# Patient Record
Sex: Male | Born: 1975 | Race: White | Hispanic: No | Marital: Married | Smoking: Current some day smoker
Health system: Southern US, Community
[De-identification: ages and names within clinical notes are randomized; demographics above are authoritative.]

## PROBLEM LIST (undated history)

## (undated) DIAGNOSIS — F909 Attention-deficit hyperactivity disorder, unspecified type: Secondary | ICD-10-CM

## (undated) DIAGNOSIS — I10 Essential (primary) hypertension: Secondary | ICD-10-CM

## (undated) DIAGNOSIS — K219 Gastro-esophageal reflux disease without esophagitis: Secondary | ICD-10-CM

## (undated) HISTORY — PX: BICEPS TENDON REPAIR: SHX566

## (undated) HISTORY — DX: Attention-deficit hyperactivity disorder, unspecified type: F90.9

## (undated) HISTORY — DX: Essential (primary) hypertension: I10

## (undated) HISTORY — DX: Gastro-esophageal reflux disease without esophagitis: K21.9

## (undated) HISTORY — PX: SHOULDER SURGERY: SHX246

---

## 1998-04-15 ENCOUNTER — Ambulatory Visit (HOSPITAL_COMMUNITY): Admission: RE | Admit: 1998-04-15 | Discharge: 1998-04-15 | Payer: Self-pay | Admitting: Sports Medicine

## 1998-04-15 ENCOUNTER — Encounter: Admission: RE | Admit: 1998-04-15 | Discharge: 1998-04-15 | Payer: Self-pay | Admitting: Sports Medicine

## 2001-02-07 ENCOUNTER — Encounter: Admission: RE | Admit: 2001-02-07 | Discharge: 2001-02-07 | Payer: Self-pay | Admitting: Family Medicine

## 2002-05-05 ENCOUNTER — Ambulatory Visit (HOSPITAL_BASED_OUTPATIENT_CLINIC_OR_DEPARTMENT_OTHER): Admission: RE | Admit: 2002-05-05 | Discharge: 2002-05-05 | Payer: Self-pay | Admitting: *Deleted

## 2003-01-26 ENCOUNTER — Ambulatory Visit (HOSPITAL_BASED_OUTPATIENT_CLINIC_OR_DEPARTMENT_OTHER): Admission: RE | Admit: 2003-01-26 | Discharge: 2003-01-26 | Payer: Self-pay | Admitting: Orthopedic Surgery

## 2010-04-18 ENCOUNTER — Encounter: Payer: Self-pay | Admitting: Sports Medicine

## 2010-08-29 NOTE — Letter (Signed)
Summary: ROI  ROI   Imported By: Marily Memos 04/19/2010 15:41:52  _____________________________________________________________________  External Attachment:    Type:   Image     Comment:   External Document

## 2013-10-26 ENCOUNTER — Other Ambulatory Visit: Payer: Self-pay | Admitting: Family Medicine

## 2013-10-26 DIAGNOSIS — R7989 Other specified abnormal findings of blood chemistry: Secondary | ICD-10-CM

## 2013-10-26 DIAGNOSIS — R945 Abnormal results of liver function studies: Principal | ICD-10-CM

## 2013-11-19 ENCOUNTER — Ambulatory Visit
Admission: RE | Admit: 2013-11-19 | Discharge: 2013-11-19 | Disposition: A | Discharge status: Home / Self Care | Payer: BC Managed Care – PPO | Source: Ambulatory Visit | Attending: Family Medicine | Admitting: Family Medicine

## 2013-11-19 DIAGNOSIS — R945 Abnormal results of liver function studies: Principal | ICD-10-CM

## 2013-11-19 DIAGNOSIS — R7989 Other specified abnormal findings of blood chemistry: Secondary | ICD-10-CM

## 2014-10-19 ENCOUNTER — Ambulatory Visit
Admission: RE | Admit: 2014-10-19 | Discharge: 2014-10-19 | Disposition: A | Discharge status: Home / Self Care | Payer: BLUE CROSS/BLUE SHIELD | Source: Ambulatory Visit | Attending: Family Medicine | Admitting: Family Medicine

## 2014-10-19 ENCOUNTER — Other Ambulatory Visit: Payer: Self-pay | Admitting: Family Medicine

## 2014-10-19 DIAGNOSIS — J209 Acute bronchitis, unspecified: Secondary | ICD-10-CM

## 2016-02-15 DIAGNOSIS — F900 Attention-deficit hyperactivity disorder, predominantly inattentive type: Secondary | ICD-10-CM | POA: Diagnosis not present

## 2016-02-15 DIAGNOSIS — Z113 Encounter for screening for infections with a predominantly sexual mode of transmission: Secondary | ICD-10-CM | POA: Diagnosis not present

## 2016-02-15 DIAGNOSIS — I10 Essential (primary) hypertension: Secondary | ICD-10-CM | POA: Diagnosis not present

## 2016-02-15 DIAGNOSIS — R600 Localized edema: Secondary | ICD-10-CM | POA: Diagnosis not present

## 2016-03-10 IMAGING — US US ABDOMEN COMPLETE
1 series · 14 of 25 positions shown · non-contrast
Comparison: None.

CLINICAL DATA: 37-year-old male with elevated LFTs.

EXAM:
ULTRASOUND ABDOMEN COMPLETE

[Series 1: us abdomen complete · 0.42mm/px · 14 of 82 slices shown]
[im 1/82]
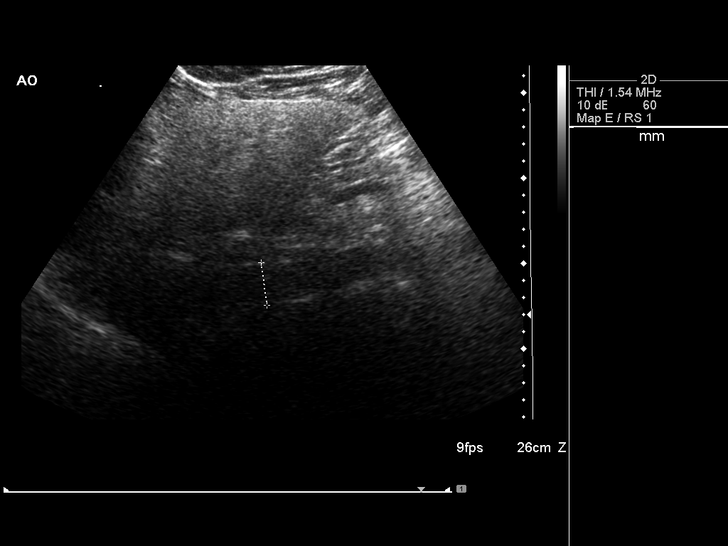
[im 7/82]
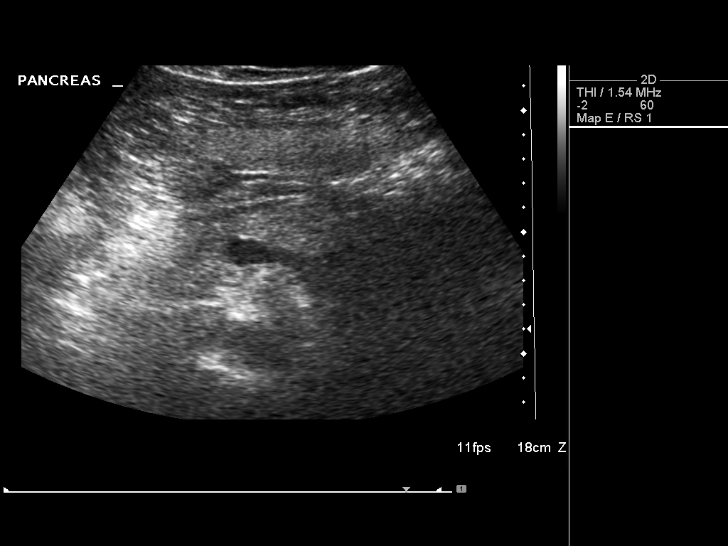
[im 14/82]
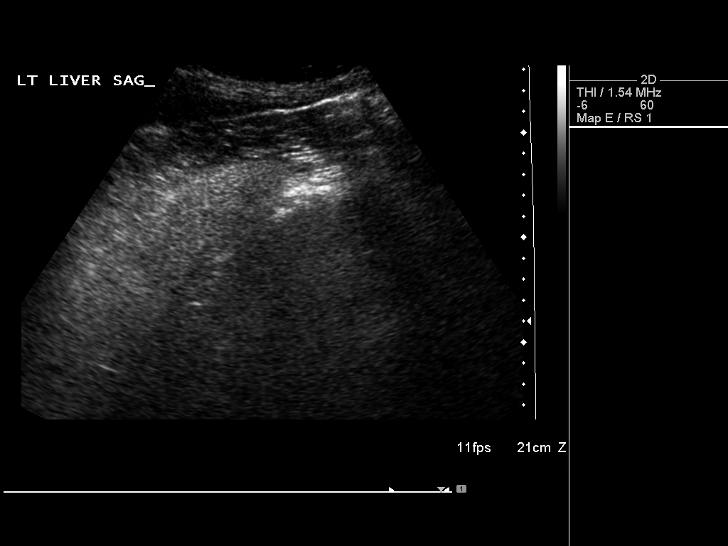
[im 21/82]
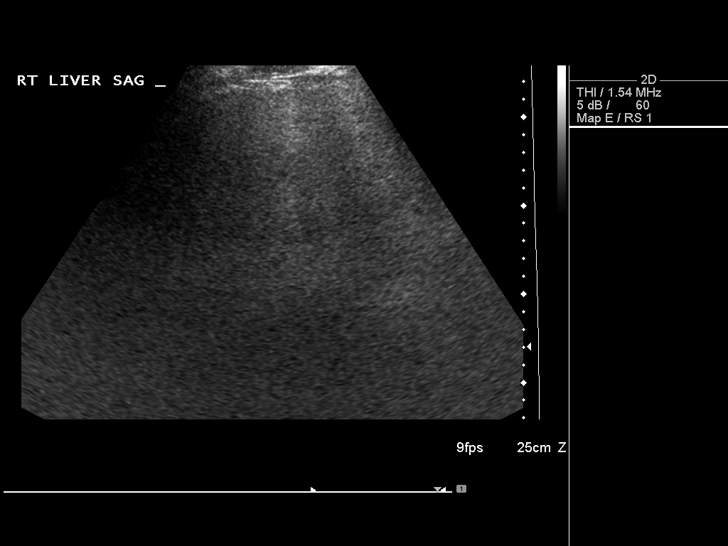
[im 28/82]
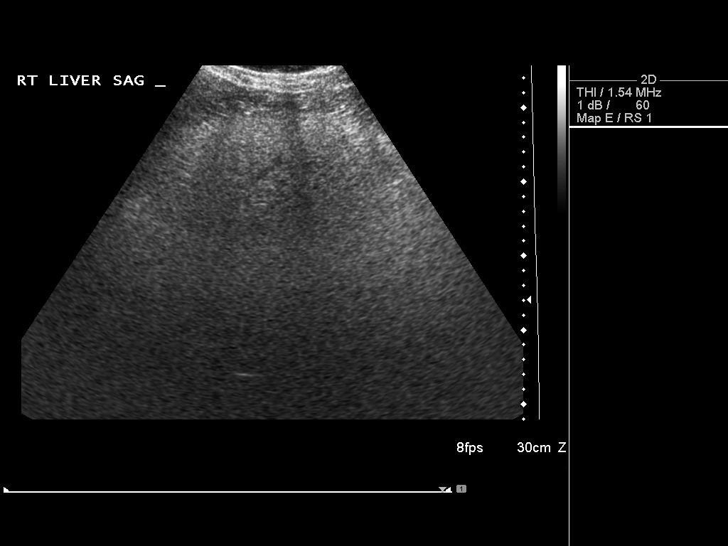
[im 31/82]
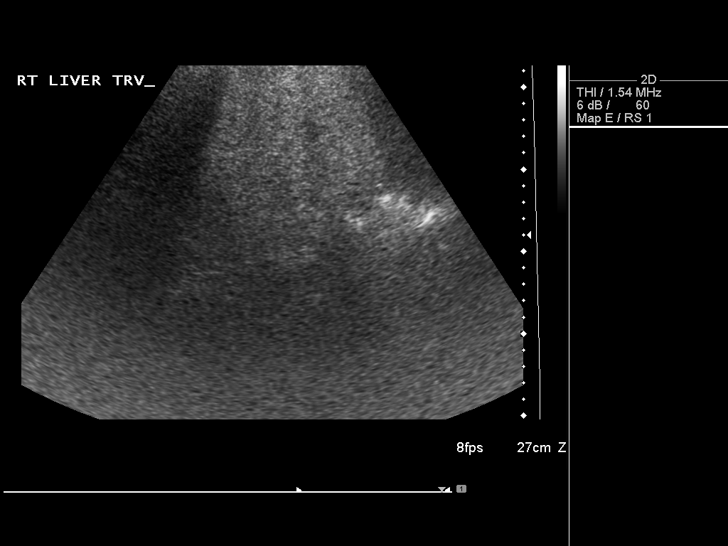
[im 38/82]
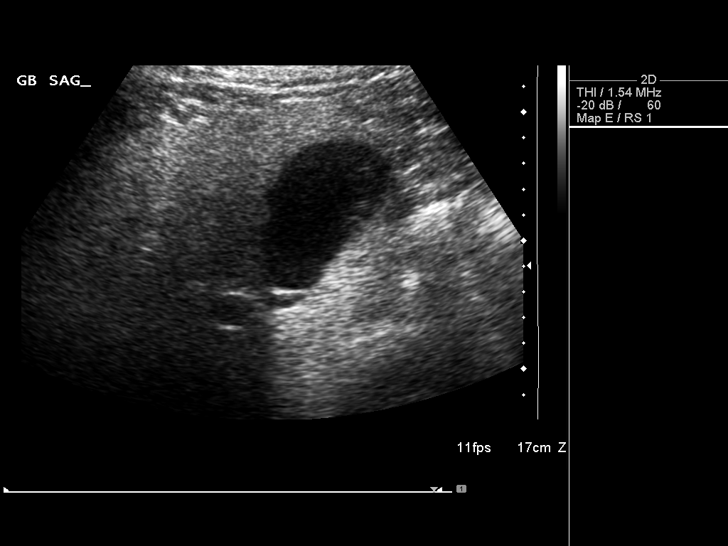
[im 44/82]
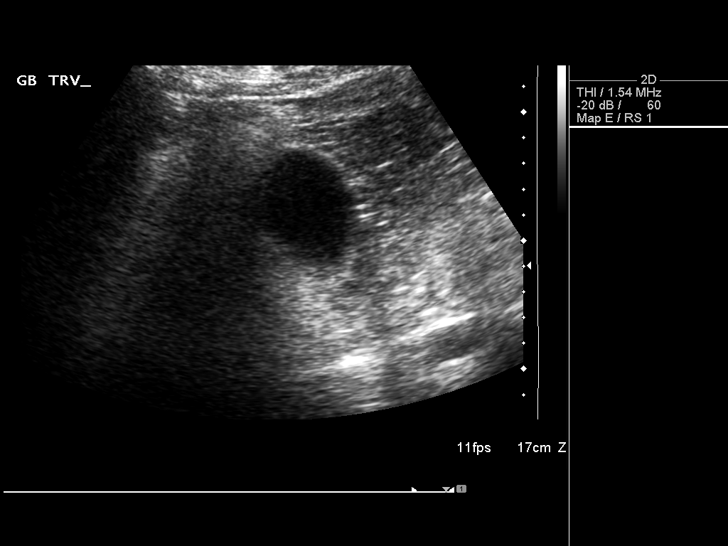
[im 51/82]
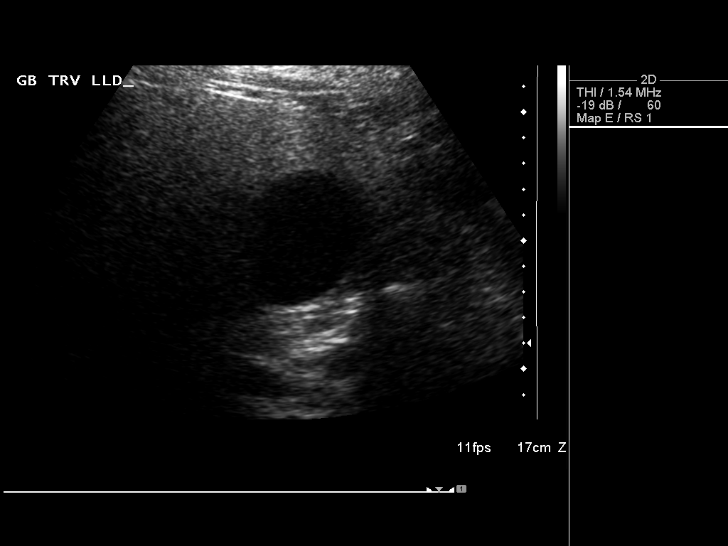
[im 55/82]
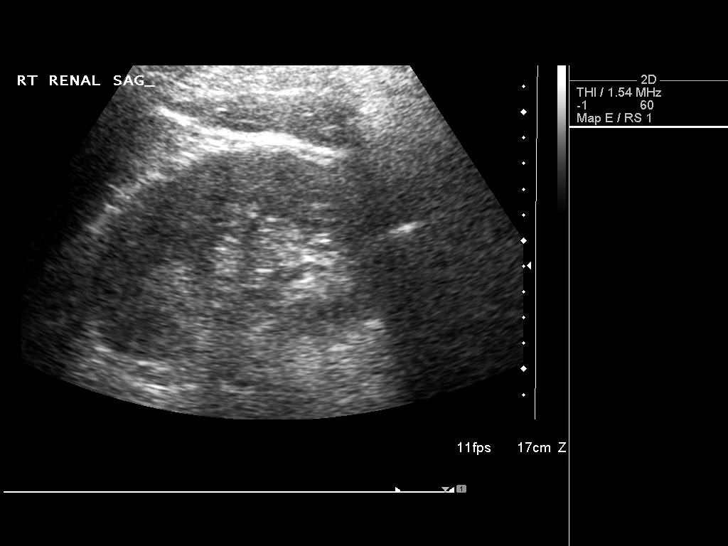
[im 61/82]
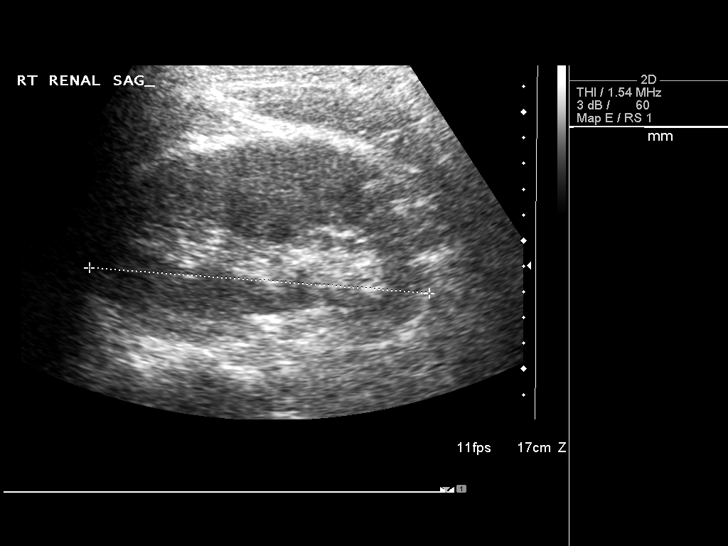
[im 68/82]
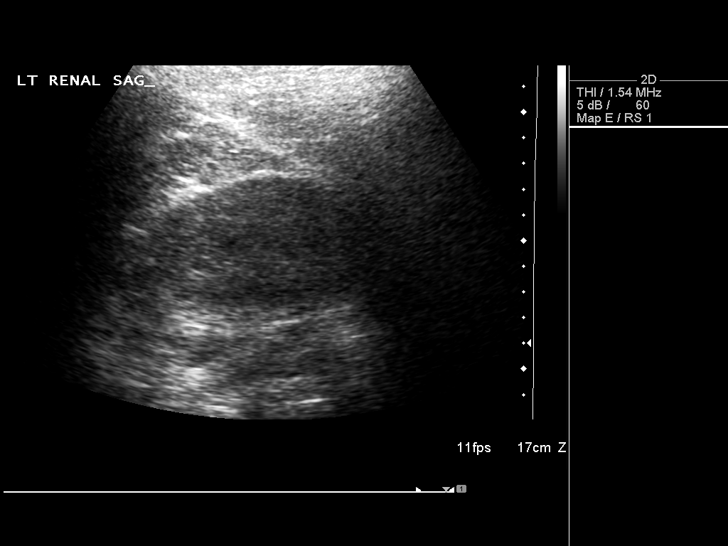
[im 75/82]
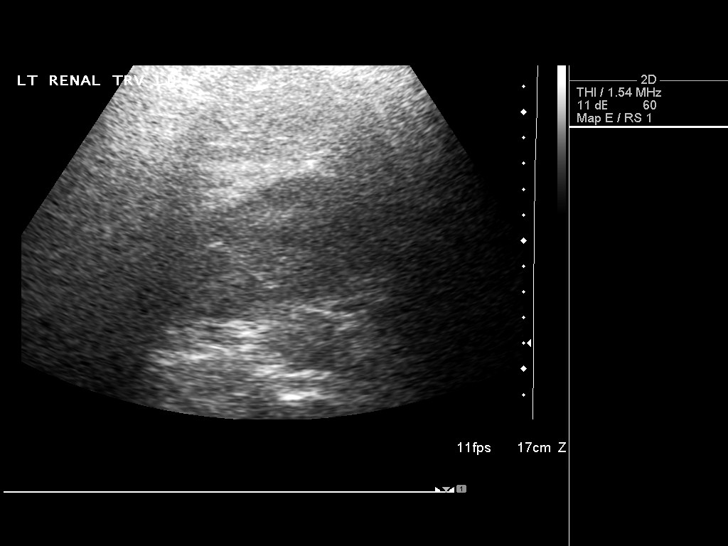
[im 82/82]
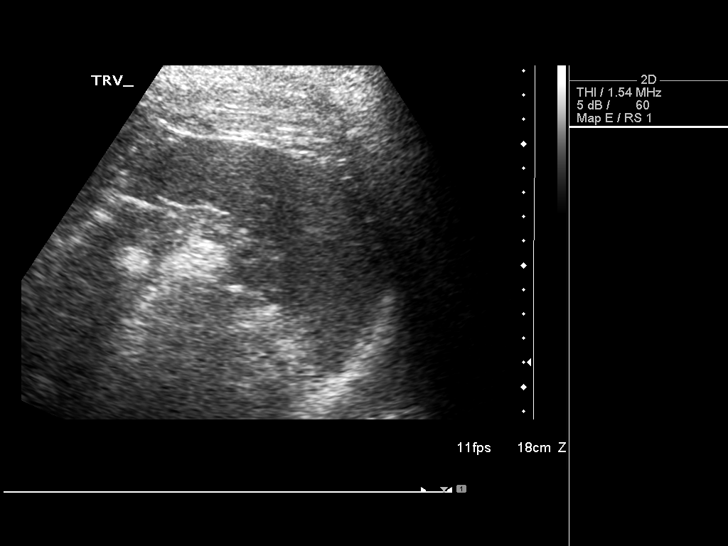

[14 of 25 positions shown; findings below may reference images not displayed]

FINDINGS: Gallbladder:

The gallbladder is unremarkable. There is no evidence of
pericholecystic fluid or sonographic Murphy sign.

Common bile duct:

Diameter: 6 mm. There is no evidence of intrahepatic or extrahepatic
biliary dilatation.

Liver:

Diffuse increased echogenicity of the liver is noted. The liver
measures 17.3 cm in craniocaudal dimension. No other hepatic
abnormalities are noted.

IVC:

No abnormality visualized but entirety difficult to visualize.

Pancreas:

Visualized portion unremarkable.

Spleen:

Size and appearance within normal limits.

Right Kidney:

Length: 13.3 cm. Echogenicity within normal limits. No mass or
hydronephrosis visualized.

Left Kidney:

Length: 14.1 cm. Echogenicity within normal limits. No mass or
hydronephrosis visualized.

Abdominal aorta:

No aneurysm visualized.

Other findings:

None.
IMPRESSION: Mild hepatomegaly with fatty infiltration of the liver.

No other significant abnormalities identified.

## 2016-03-20 DIAGNOSIS — F900 Attention-deficit hyperactivity disorder, predominantly inattentive type: Secondary | ICD-10-CM | POA: Diagnosis not present

## 2016-03-20 DIAGNOSIS — I10 Essential (primary) hypertension: Secondary | ICD-10-CM | POA: Diagnosis not present

## 2016-05-22 DIAGNOSIS — Z23 Encounter for immunization: Secondary | ICD-10-CM | POA: Diagnosis not present

## 2016-05-22 DIAGNOSIS — F900 Attention-deficit hyperactivity disorder, predominantly inattentive type: Secondary | ICD-10-CM | POA: Diagnosis not present

## 2016-05-22 DIAGNOSIS — I10 Essential (primary) hypertension: Secondary | ICD-10-CM | POA: Diagnosis not present

## 2016-08-13 DIAGNOSIS — D2362 Other benign neoplasm of skin of left upper limb, including shoulder: Secondary | ICD-10-CM | POA: Diagnosis not present

## 2016-08-13 DIAGNOSIS — D225 Melanocytic nevi of trunk: Secondary | ICD-10-CM | POA: Diagnosis not present

## 2016-08-13 DIAGNOSIS — D2262 Melanocytic nevi of left upper limb, including shoulder: Secondary | ICD-10-CM | POA: Diagnosis not present

## 2016-08-13 DIAGNOSIS — D2361 Other benign neoplasm of skin of right upper limb, including shoulder: Secondary | ICD-10-CM | POA: Diagnosis not present

## 2016-11-20 DIAGNOSIS — F900 Attention-deficit hyperactivity disorder, predominantly inattentive type: Secondary | ICD-10-CM | POA: Diagnosis not present

## 2016-11-20 DIAGNOSIS — I10 Essential (primary) hypertension: Secondary | ICD-10-CM | POA: Diagnosis not present

## 2017-02-07 IMAGING — CR DG CHEST 2V
3 series · 3 of 3 positions shown · non-contrast
Comparison: None

CLINICAL DATA: Cough, chest congestion and shortness of breath for
4 days, acute bronchitis with unspecified organism

EXAM:
CHEST  2 VIEW

[w chest pa]
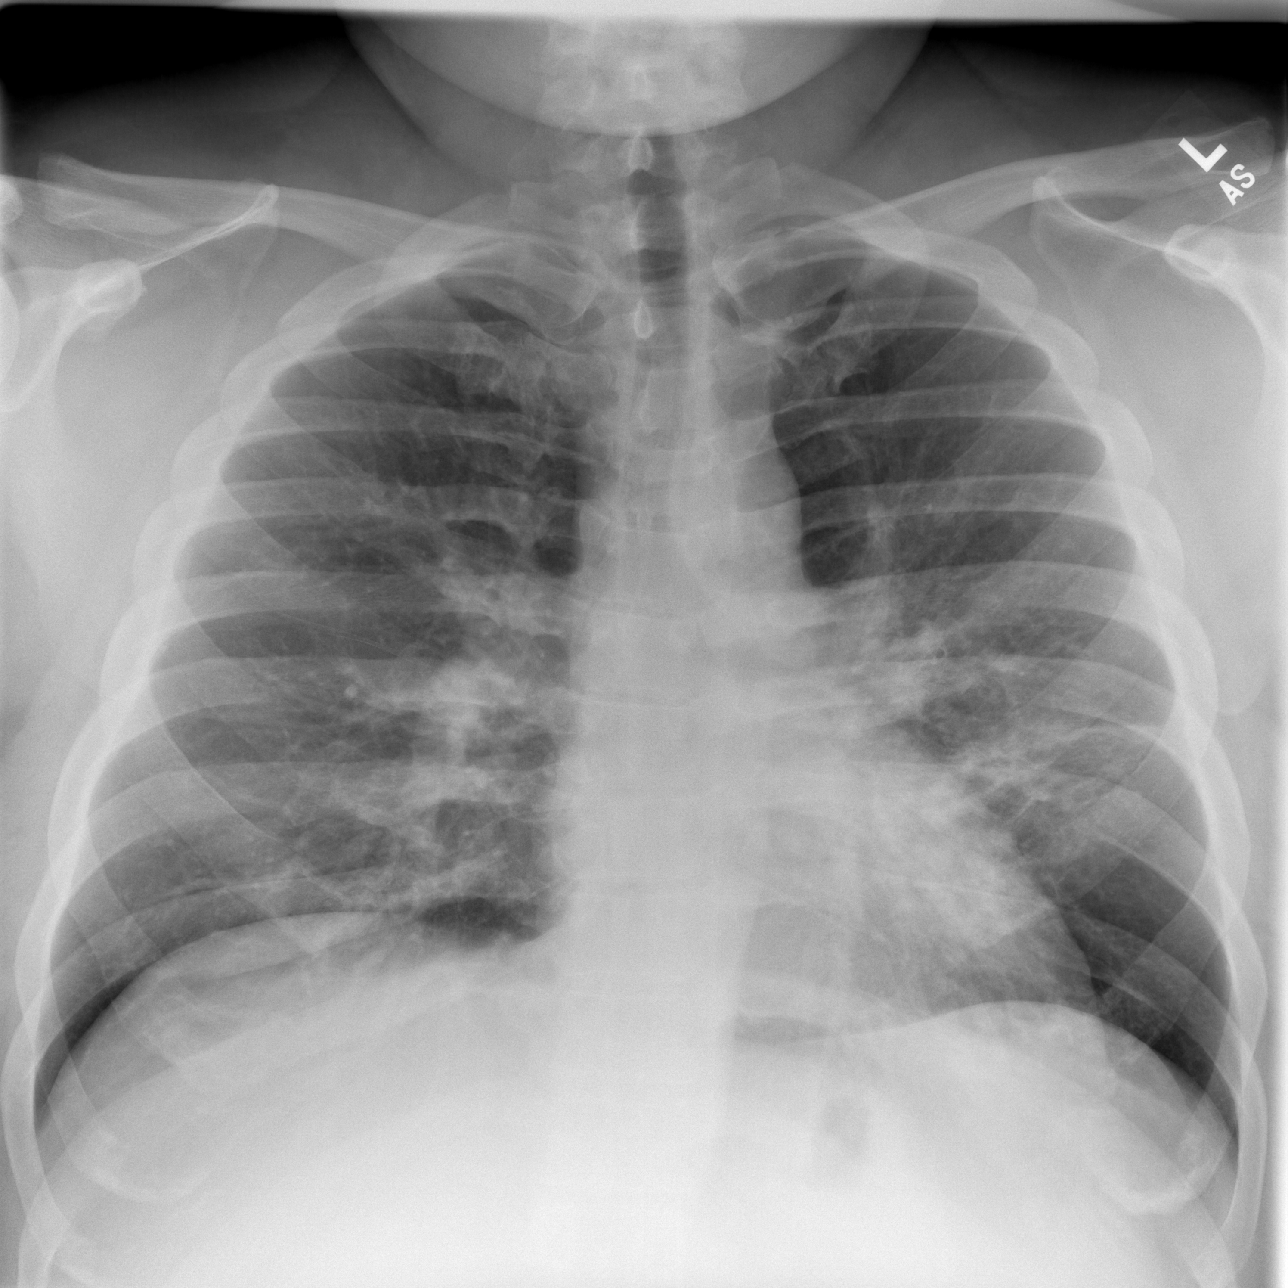

[w chest lat *]
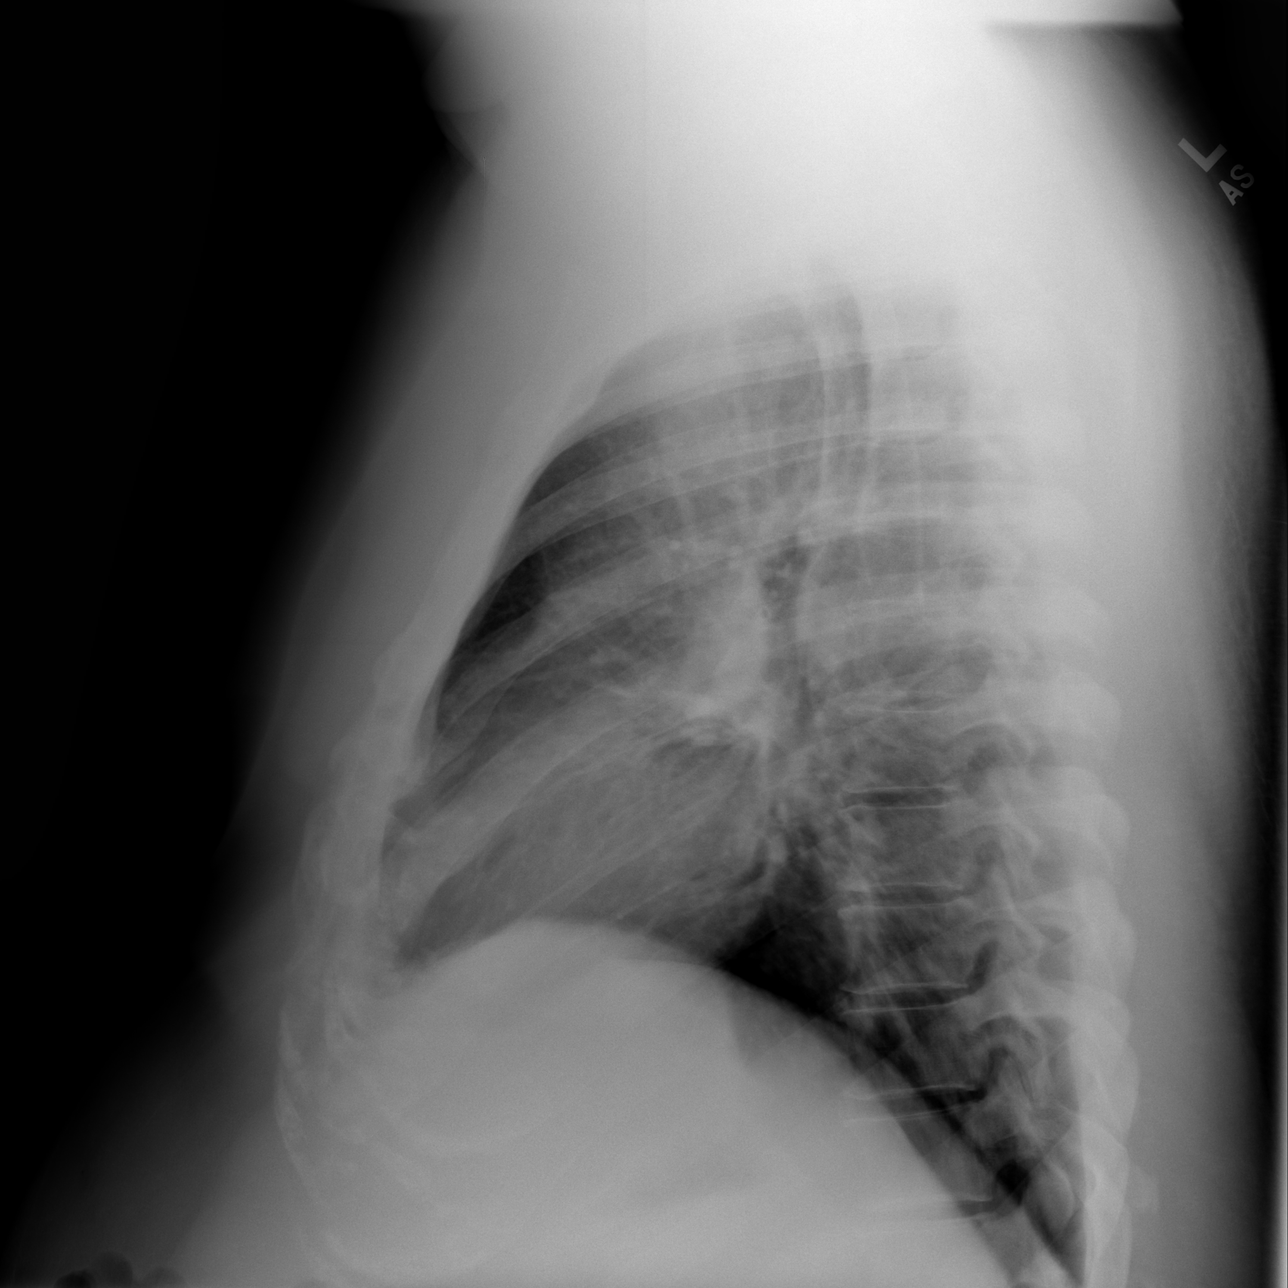

[w chest lat]
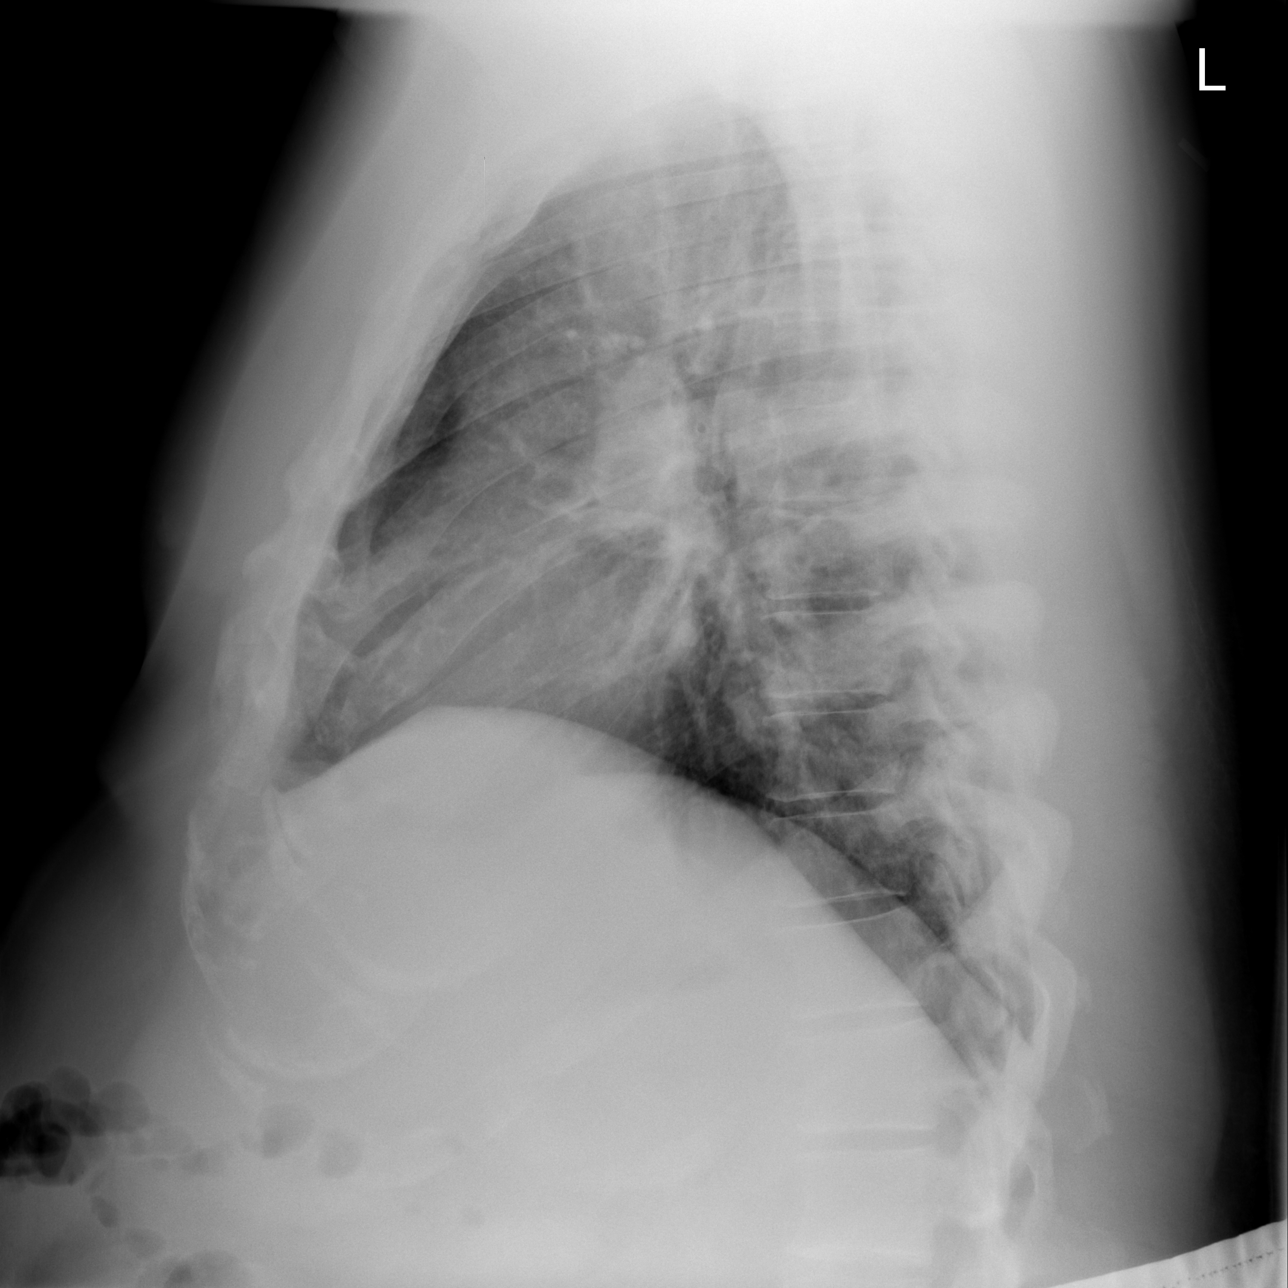

[3 of 3 positions shown; findings below may reference images not displayed]

FINDINGS: Normal heart size, mediastinal contours and pulmonary vascularity.

LEFT perihilar infiltrate consistent with pneumonia, poorly
localized on lateral view which is limited by respiratory motion,
probably within LEFT lower lobe.

Central peribronchial thickening.

No pleural effusion or pneumothorax.

Osseous structures unremarkable.
IMPRESSION: Bronchitic changes with LEFT perihilar infiltrate consistent with
pneumonia.

## 2017-03-12 DIAGNOSIS — L923 Foreign body granuloma of the skin and subcutaneous tissue: Secondary | ICD-10-CM | POA: Diagnosis not present

## 2017-03-26 DIAGNOSIS — Z3009 Encounter for other general counseling and advice on contraception: Secondary | ICD-10-CM | POA: Diagnosis not present

## 2017-05-23 DIAGNOSIS — Z13220 Encounter for screening for lipoid disorders: Secondary | ICD-10-CM | POA: Diagnosis not present

## 2017-05-23 DIAGNOSIS — Z23 Encounter for immunization: Secondary | ICD-10-CM | POA: Diagnosis not present

## 2017-05-23 DIAGNOSIS — I10 Essential (primary) hypertension: Secondary | ICD-10-CM | POA: Diagnosis not present

## 2017-05-23 DIAGNOSIS — F900 Attention-deficit hyperactivity disorder, predominantly inattentive type: Secondary | ICD-10-CM | POA: Diagnosis not present

## 2017-06-26 DIAGNOSIS — R079 Chest pain, unspecified: Secondary | ICD-10-CM | POA: Diagnosis not present

## 2017-11-21 DIAGNOSIS — I10 Essential (primary) hypertension: Secondary | ICD-10-CM | POA: Diagnosis not present

## 2017-11-21 DIAGNOSIS — F900 Attention-deficit hyperactivity disorder, predominantly inattentive type: Secondary | ICD-10-CM | POA: Diagnosis not present

## 2017-12-19 DIAGNOSIS — S46211A Strain of muscle, fascia and tendon of other parts of biceps, right arm, initial encounter: Secondary | ICD-10-CM | POA: Diagnosis not present

## 2017-12-25 DIAGNOSIS — M66821 Spontaneous rupture of other tendons, right upper arm: Secondary | ICD-10-CM | POA: Diagnosis not present

## 2017-12-25 DIAGNOSIS — S46291A Other injury of muscle, fascia and tendon of other parts of biceps, right arm, initial encounter: Secondary | ICD-10-CM | POA: Diagnosis not present

## 2018-01-02 DIAGNOSIS — M66821 Spontaneous rupture of other tendons, right upper arm: Secondary | ICD-10-CM | POA: Diagnosis not present

## 2018-01-09 DIAGNOSIS — M66821 Spontaneous rupture of other tendons, right upper arm: Secondary | ICD-10-CM | POA: Diagnosis not present

## 2018-01-16 DIAGNOSIS — M66821 Spontaneous rupture of other tendons, right upper arm: Secondary | ICD-10-CM | POA: Diagnosis not present

## 2018-01-28 DIAGNOSIS — S46211D Strain of muscle, fascia and tendon of other parts of biceps, right arm, subsequent encounter: Secondary | ICD-10-CM | POA: Diagnosis not present

## 2018-02-11 DIAGNOSIS — S46211D Strain of muscle, fascia and tendon of other parts of biceps, right arm, subsequent encounter: Secondary | ICD-10-CM | POA: Diagnosis not present

## 2018-02-13 DIAGNOSIS — M66821 Spontaneous rupture of other tendons, right upper arm: Secondary | ICD-10-CM | POA: Diagnosis not present

## 2018-02-13 DIAGNOSIS — M25521 Pain in right elbow: Secondary | ICD-10-CM | POA: Diagnosis not present

## 2018-02-13 DIAGNOSIS — M25621 Stiffness of right elbow, not elsewhere classified: Secondary | ICD-10-CM | POA: Diagnosis not present

## 2018-02-13 DIAGNOSIS — M6281 Muscle weakness (generalized): Secondary | ICD-10-CM | POA: Diagnosis not present

## 2018-02-17 DIAGNOSIS — M6281 Muscle weakness (generalized): Secondary | ICD-10-CM | POA: Diagnosis not present

## 2018-02-17 DIAGNOSIS — M25521 Pain in right elbow: Secondary | ICD-10-CM | POA: Diagnosis not present

## 2018-02-17 DIAGNOSIS — G4733 Obstructive sleep apnea (adult) (pediatric): Secondary | ICD-10-CM | POA: Diagnosis not present

## 2018-02-17 DIAGNOSIS — M66821 Spontaneous rupture of other tendons, right upper arm: Secondary | ICD-10-CM | POA: Diagnosis not present

## 2018-02-17 DIAGNOSIS — M25621 Stiffness of right elbow, not elsewhere classified: Secondary | ICD-10-CM | POA: Diagnosis not present

## 2018-02-19 DIAGNOSIS — M25521 Pain in right elbow: Secondary | ICD-10-CM | POA: Diagnosis not present

## 2018-02-19 DIAGNOSIS — M66821 Spontaneous rupture of other tendons, right upper arm: Secondary | ICD-10-CM | POA: Diagnosis not present

## 2018-02-19 DIAGNOSIS — M25621 Stiffness of right elbow, not elsewhere classified: Secondary | ICD-10-CM | POA: Diagnosis not present

## 2018-02-19 DIAGNOSIS — M6281 Muscle weakness (generalized): Secondary | ICD-10-CM | POA: Diagnosis not present

## 2018-02-24 DIAGNOSIS — M66821 Spontaneous rupture of other tendons, right upper arm: Secondary | ICD-10-CM | POA: Diagnosis not present

## 2018-02-24 DIAGNOSIS — M6281 Muscle weakness (generalized): Secondary | ICD-10-CM | POA: Diagnosis not present

## 2018-02-24 DIAGNOSIS — M25521 Pain in right elbow: Secondary | ICD-10-CM | POA: Diagnosis not present

## 2018-02-24 DIAGNOSIS — M25621 Stiffness of right elbow, not elsewhere classified: Secondary | ICD-10-CM | POA: Diagnosis not present

## 2018-02-25 DIAGNOSIS — M25621 Stiffness of right elbow, not elsewhere classified: Secondary | ICD-10-CM | POA: Diagnosis not present

## 2018-02-26 DIAGNOSIS — M25521 Pain in right elbow: Secondary | ICD-10-CM | POA: Diagnosis not present

## 2018-02-26 DIAGNOSIS — M6281 Muscle weakness (generalized): Secondary | ICD-10-CM | POA: Diagnosis not present

## 2018-02-26 DIAGNOSIS — M25621 Stiffness of right elbow, not elsewhere classified: Secondary | ICD-10-CM | POA: Diagnosis not present

## 2018-02-26 DIAGNOSIS — M66821 Spontaneous rupture of other tendons, right upper arm: Secondary | ICD-10-CM | POA: Diagnosis not present

## 2018-03-03 DIAGNOSIS — M66821 Spontaneous rupture of other tendons, right upper arm: Secondary | ICD-10-CM | POA: Diagnosis not present

## 2018-03-03 DIAGNOSIS — M25621 Stiffness of right elbow, not elsewhere classified: Secondary | ICD-10-CM | POA: Diagnosis not present

## 2018-03-03 DIAGNOSIS — M25521 Pain in right elbow: Secondary | ICD-10-CM | POA: Diagnosis not present

## 2018-03-03 DIAGNOSIS — M6281 Muscle weakness (generalized): Secondary | ICD-10-CM | POA: Diagnosis not present

## 2018-03-05 DIAGNOSIS — M25621 Stiffness of right elbow, not elsewhere classified: Secondary | ICD-10-CM | POA: Diagnosis not present

## 2018-03-05 DIAGNOSIS — M25521 Pain in right elbow: Secondary | ICD-10-CM | POA: Diagnosis not present

## 2018-03-05 DIAGNOSIS — M6281 Muscle weakness (generalized): Secondary | ICD-10-CM | POA: Diagnosis not present

## 2018-03-05 DIAGNOSIS — M66821 Spontaneous rupture of other tendons, right upper arm: Secondary | ICD-10-CM | POA: Diagnosis not present

## 2018-03-10 DIAGNOSIS — M66821 Spontaneous rupture of other tendons, right upper arm: Secondary | ICD-10-CM | POA: Diagnosis not present

## 2018-03-10 DIAGNOSIS — M25521 Pain in right elbow: Secondary | ICD-10-CM | POA: Diagnosis not present

## 2018-03-10 DIAGNOSIS — M25621 Stiffness of right elbow, not elsewhere classified: Secondary | ICD-10-CM | POA: Diagnosis not present

## 2018-03-10 DIAGNOSIS — M6281 Muscle weakness (generalized): Secondary | ICD-10-CM | POA: Diagnosis not present

## 2018-03-12 DIAGNOSIS — M25521 Pain in right elbow: Secondary | ICD-10-CM | POA: Diagnosis not present

## 2018-03-12 DIAGNOSIS — M6281 Muscle weakness (generalized): Secondary | ICD-10-CM | POA: Diagnosis not present

## 2018-03-12 DIAGNOSIS — M25621 Stiffness of right elbow, not elsewhere classified: Secondary | ICD-10-CM | POA: Diagnosis not present

## 2018-03-12 DIAGNOSIS — M66821 Spontaneous rupture of other tendons, right upper arm: Secondary | ICD-10-CM | POA: Diagnosis not present

## 2018-03-18 DIAGNOSIS — M25621 Stiffness of right elbow, not elsewhere classified: Secondary | ICD-10-CM | POA: Diagnosis not present

## 2018-03-18 DIAGNOSIS — M25521 Pain in right elbow: Secondary | ICD-10-CM | POA: Diagnosis not present

## 2018-03-18 DIAGNOSIS — M66821 Spontaneous rupture of other tendons, right upper arm: Secondary | ICD-10-CM | POA: Diagnosis not present

## 2018-03-18 DIAGNOSIS — M6281 Muscle weakness (generalized): Secondary | ICD-10-CM | POA: Diagnosis not present

## 2018-03-25 DIAGNOSIS — H5213 Myopia, bilateral: Secondary | ICD-10-CM | POA: Diagnosis not present

## 2018-04-08 DIAGNOSIS — M25521 Pain in right elbow: Secondary | ICD-10-CM | POA: Diagnosis not present

## 2018-06-04 DIAGNOSIS — I10 Essential (primary) hypertension: Secondary | ICD-10-CM | POA: Diagnosis not present

## 2018-06-04 DIAGNOSIS — Z6841 Body Mass Index (BMI) 40.0 and over, adult: Secondary | ICD-10-CM | POA: Diagnosis not present

## 2018-06-04 DIAGNOSIS — F909 Attention-deficit hyperactivity disorder, unspecified type: Secondary | ICD-10-CM | POA: Diagnosis not present

## 2018-08-19 DIAGNOSIS — D2361 Other benign neoplasm of skin of right upper limb, including shoulder: Secondary | ICD-10-CM | POA: Diagnosis not present

## 2018-08-19 DIAGNOSIS — D2362 Other benign neoplasm of skin of left upper limb, including shoulder: Secondary | ICD-10-CM | POA: Diagnosis not present

## 2018-08-19 DIAGNOSIS — D235 Other benign neoplasm of skin of trunk: Secondary | ICD-10-CM | POA: Diagnosis not present

## 2018-08-19 DIAGNOSIS — D485 Neoplasm of uncertain behavior of skin: Secondary | ICD-10-CM | POA: Diagnosis not present

## 2018-09-24 DIAGNOSIS — Z131 Encounter for screening for diabetes mellitus: Secondary | ICD-10-CM | POA: Diagnosis not present

## 2018-09-24 DIAGNOSIS — Z0000 Encounter for general adult medical examination without abnormal findings: Secondary | ICD-10-CM | POA: Diagnosis not present

## 2018-09-24 DIAGNOSIS — Z13220 Encounter for screening for lipoid disorders: Secondary | ICD-10-CM | POA: Diagnosis not present

## 2018-09-24 DIAGNOSIS — Z6841 Body Mass Index (BMI) 40.0 and over, adult: Secondary | ICD-10-CM | POA: Diagnosis not present

## 2019-03-25 DIAGNOSIS — I10 Essential (primary) hypertension: Secondary | ICD-10-CM | POA: Diagnosis not present

## 2019-03-25 DIAGNOSIS — E785 Hyperlipidemia, unspecified: Secondary | ICD-10-CM | POA: Diagnosis not present

## 2019-03-25 DIAGNOSIS — F909 Attention-deficit hyperactivity disorder, unspecified type: Secondary | ICD-10-CM | POA: Diagnosis not present

## 2019-05-13 ENCOUNTER — Ambulatory Visit: Payer: Self-pay

## 2019-05-25 ENCOUNTER — Other Ambulatory Visit: Payer: Self-pay

## 2019-05-25 ENCOUNTER — Ambulatory Visit: Payer: Self-pay

## 2019-07-16 ENCOUNTER — Encounter: Payer: Self-pay | Admitting: Physician Assistant

## 2019-08-06 ENCOUNTER — Other Ambulatory Visit: Payer: Self-pay

## 2019-08-06 ENCOUNTER — Ambulatory Visit: Payer: No Typology Code available for payment source | Admitting: Physician Assistant

## 2019-08-06 ENCOUNTER — Encounter: Payer: Self-pay | Admitting: Physician Assistant

## 2019-08-06 VITALS — BP 122/80 | HR 84 | Temp 96.4°F | Ht 72.0 in | Wt 280.2 lb

## 2019-08-06 DIAGNOSIS — Z1159 Encounter for screening for other viral diseases: Secondary | ICD-10-CM

## 2019-08-06 DIAGNOSIS — R12 Heartburn: Secondary | ICD-10-CM

## 2019-08-06 DIAGNOSIS — K219 Gastro-esophageal reflux disease without esophagitis: Secondary | ICD-10-CM

## 2019-08-06 NOTE — Patient Instructions (Signed)
If you are age 43 or older, your body mass index should be between 23-30. Your Body mass index is 38.01 kg/m. If this is out of the aforementioned range listed, please consider follow up with your Primary Care Provider.  If you are age 70 or younger, your body mass index should be between 19-25. Your Body mass index is 38.01 kg/m. If this is out of the aformentioned range listed, please consider follow up with your Primary Care Provider.   You have been scheduled for an endoscopy. Please follow written instructions given to you at your visit today. If you use inhalers (even only as needed), please bring them with you on the day of your procedure.

## 2019-08-06 NOTE — Progress Notes (Signed)
Chief Complaint: GERD  HPI:    Rodney Jordan is a 44 year old Caucasian male with a past medical history as listed below, who presents to clinic today as a referral from his primary care provider Dr. Dareen Piano for complaint of GERD.    07/08/2019 patient saw PCP and complained of reflux.  He was started on Omeprazole 20 mg daily.    Today, the patient presents to clinic and explains that for the past 1-1/2 to 2 years he has been dealing with regurgitation.  Tells me that it initially started after eating ice cream and he thought it was possibly just dairy but now it is after anything that he eats.  It can happen 5 minutes after eating or up to an hour after eating.  He also developed heartburn over the past 6 months, but when he was started on Omeprazole 20 mg daily this took care of that symptom.  Continues with regurgitation of food after eating at any meal.    Also complains of irregular bowel habits.  Tells me that it has always been this way his whole life but he can go 5 days without a bowel movement or he can have 5 stools a day.  This is somewhat unpredictable but he is typically more constipated than the other.  Has tried MiraLAX in the past for 2 weeks straight which did not help at all.    Social history positive for already having lost 120 pounds over the past year or so with a healthier diet and exercise.  Does admit to drinking three 8 ounce cups of coffee per day but avoids all other caffeine and has a pretty clean diet with low-carb and high-protein.    Denies fever, chills, weight loss, anorexia, nausea, vomiting, abdominal pain or melena.  Past Medical History:  Diagnosis Date  . Acid reflux   . ADHD   . Hypertension     Past Surgical History:  Procedure Laterality Date  . BICEPS TENDON REPAIR Right     Current Outpatient Medications  Medication Sig Dispense Refill  . ADDERALL XR 30 MG 24 hr capsule Take 30 mg by mouth every morning.    Marland Kitchen amLODipine (NORVASC) 5 MG tablet  Take 5 mg by mouth daily.    . chlorthalidone (HYGROTON) 25 MG tablet Take 1 tablet by mouth daily.    . irbesartan (AVAPRO) 300 MG tablet Take 300 mg by mouth at bedtime.    Marland Kitchen loratadine (CLARITIN) 10 MG tablet Take 1 tablet by mouth daily.    . Multiple Vitamin (MULTIVITAMIN) capsule Take 1 capsule by mouth daily.    Marland Kitchen omeprazole (PRILOSEC) 20 MG capsule Take 1 capsule by mouth daily.     No current facility-administered medications for this visit.    Allergies as of 08/06/2019  . (No Known Allergies)    Family History  Problem Relation Age of Onset  . Ovarian cancer Mother   . Heart disease Father   . Diabetes Father   . Hypertension Father   . Hypertension Brother   . Hypertension Brother     Social History   Socioeconomic History  . Marital status: Married    Spouse name: Not on file  . Number of children: Not on file  . Years of education: Not on file  . Highest education level: Not on file  Occupational History  . Not on file  Tobacco Use  . Smoking status: Current Some Day Smoker    Types: Cigars  . Smokeless  tobacco: Former Engineer, water and Sexual Activity  . Alcohol use: Yes    Comment: 1 drink every two weeks  . Drug use: Never  . Sexual activity: Not on file  Other Topics Concern  . Not on file  Social History Narrative  . Not on file   Social Determinants of Health   Financial Resource Strain:   . Difficulty of Paying Living Expenses: Not on file  Food Insecurity:   . Worried About Programme researcher, broadcasting/film/video in the Last Year: Not on file  . Ran Out of Food in the Last Year: Not on file  Transportation Needs:   . Lack of Transportation (Medical): Not on file  . Lack of Transportation (Non-Medical): Not on file  Physical Activity:   . Days of Exercise per Week: Not on file  . Minutes of Exercise per Session: Not on file  Stress:   . Feeling of Stress : Not on file  Social Connections:   . Frequency of Communication with Friends and Family: Not on  file  . Frequency of Social Gatherings with Friends and Family: Not on file  . Attends Religious Services: Not on file  . Active Member of Clubs or Organizations: Not on file  . Attends Banker Meetings: Not on file  . Marital Status: Not on file  Intimate Partner Violence:   . Fear of Current or Ex-Partner: Not on file  . Emotionally Abused: Not on file  . Physically Abused: Not on file  . Sexually Abused: Not on file    Review of Systems:    Constitutional: No weight loss, fever or chills Skin: No rash  Cardiovascular: No chest pain Respiratory: No SOB Gastrointestinal: See HPI and otherwise negative Genitourinary: No dysuria Neurological: No headache, dizziness or syncope Musculoskeletal: No new muscle or joint pain Hematologic: No bleeding Psychiatric: No history of depression or anxiety   Physical Exam:  Vital signs: BP 122/80   Pulse 84   Temp (!) 96.4 F (35.8 C)   Ht 6' (1.829 m)   Wt 280 lb 4 oz (127.1 kg)   BMI 38.01 kg/m    Constitutional:   Pleasant Caucasian male appears to be in NAD, Well developed, Well nourished, alert and cooperative Head:  Normocephalic and atraumatic. Eyes:   PEERL, EOMI. No icterus. Conjunctiva pink. Ears:  Normal auditory acuity. Neck:  Supple Throat: Oral cavity and pharynx without inflammation, swelling or lesion.  Respiratory: Respirations even and unlabored. Lungs clear to auscultation bilaterally.   No wheezes, crackles, or rhonchi.  Cardiovascular: Normal S1, S2. No MRG. Regular rate and rhythm. No peripheral edema, cyanosis or pallor.  Gastrointestinal:  Soft, nondistended, nontender. No rebound or guarding. Normal bowel sounds. No appreciable masses or hepatomegaly. Rectal:  Not performed.  Msk:  Symmetrical without gross deformities. Without edema, no deformity or joint abnormality.  Neurologic:  Alert and  oriented x4;  grossly normal neurologically.  Skin:   Dry and intact without significant lesions or  rashes. Psychiatric: Demonstrates good judgement and reason without abnormal affect or behaviors.  No recent labs or imaging.  Assessment: 1.  GERD: Continues with regurgitation of foods and liquids after eating/drinking over the past year and 1/2 to 2 years; consider gastritis +/-H. pylori 2.  Heartburn: No longer experiencing burning pain since starting Omeprazole  Plan: 1.  Scheduled patient for an EGD in the [facility] given length and frequency of symptoms.  Patient requested Dr. Rhea Belton as he sees his wife.  Did  discuss risks, benefits, limitations and alternatives and the patient agrees to proceed.  Patient will be scheduled for Covid test 2 days prior to time of procedure. 2.  Continue Omeprazole 20 mg daily for now.  Did discuss with the patient that pending findings of EGD this may be increased and/or changed. 3.  Reviewed antireflux diet and lifestyle modifications. 4.  Patient to follow in clinic per recommendations from Dr. Hilarie Fredrickson after time of procedure.  Ellouise Newer, PA-C [facility] 08/06/2019, 8:25 AM  Cc: No ref. provider found

## 2019-08-07 NOTE — Progress Notes (Signed)
Addendum: Reviewed and agree with assessment and management plan. Orby Tangen M, MD  

## 2019-08-25 ENCOUNTER — Ambulatory Visit (INDEPENDENT_AMBULATORY_CARE_PROVIDER_SITE_OTHER): Payer: No Typology Code available for payment source

## 2019-08-25 ENCOUNTER — Other Ambulatory Visit: Payer: Self-pay | Admitting: Internal Medicine

## 2019-08-25 DIAGNOSIS — Z1159 Encounter for screening for other viral diseases: Secondary | ICD-10-CM

## 2019-08-25 LAB — SARS CORONAVIRUS 2 (TAT 6-24 HRS): SARS Coronavirus 2: NEGATIVE

## 2019-08-27 ENCOUNTER — Ambulatory Visit (AMBULATORY_SURGERY_CENTER): Payer: No Typology Code available for payment source | Admitting: Internal Medicine

## 2019-08-27 ENCOUNTER — Other Ambulatory Visit: Payer: Self-pay

## 2019-08-27 ENCOUNTER — Encounter: Payer: Self-pay | Admitting: Internal Medicine

## 2019-08-27 VITALS — BP 117/74 | HR 69 | Temp 96.8°F | Resp 15 | Ht 72.0 in | Wt 280.0 lb

## 2019-08-27 DIAGNOSIS — K2950 Unspecified chronic gastritis without bleeding: Secondary | ICD-10-CM

## 2019-08-27 DIAGNOSIS — K219 Gastro-esophageal reflux disease without esophagitis: Secondary | ICD-10-CM | POA: Diagnosis not present

## 2019-08-27 DIAGNOSIS — K319 Disease of stomach and duodenum, unspecified: Secondary | ICD-10-CM | POA: Diagnosis not present

## 2019-08-27 MED ORDER — SODIUM CHLORIDE 0.9 % IV SOLN: 500.0000 mL | Freq: Once | INTRAVENOUS | Status: DC

## 2019-08-27 MED ORDER — OMEPRAZOLE 20 MG PO CPDR: 40.0000 mg | DELAYED_RELEASE_CAPSULE | Freq: Every day | ORAL | 1 refills | Status: DC

## 2019-08-27 NOTE — Progress Notes (Signed)
Called to room to assist during endoscopic procedure.  Patient ID and intended procedure confirmed with present staff. Received instructions for my participation in the procedure from the performing physician.  

## 2019-08-27 NOTE — Progress Notes (Signed)
PT taken to PACU. Monitors in place. VSS. Report given to RN. 

## 2019-08-27 NOTE — Patient Instructions (Signed)
Await pathology results Increase omeprazole to 40mg  once a day   YOU HAD AN ENDOSCOPIC PROCEDURE TODAY AT THE [facility]:   Refer to the procedure report that was given to you for any specific questions about what was found during the examination.  If the procedure report does not answer your questions, please call your gastroenterologist to clarify.  If you requested that your care partner not be given the details of your procedure findings, then the procedure report has been included in a sealed envelope for you to review at your convenience later.  YOU SHOULD EXPECT: Some feelings of bloating in the abdomen. Passage of more gas than usual.  Walking can help get rid of the air that was put into your GI tract during the procedure and reduce the bloating. If you had a lower endoscopy (such as a colonoscopy or flexible sigmoidoscopy) you may notice spotting of blood in your stool or on the toilet paper. If you underwent a bowel prep for your procedure, you may not have a normal bowel movement for a few days.  Please Note:  You might notice some irritation and congestion in your nose or some drainage.  This is from the oxygen used during your procedure.  There is no need for concern and it should clear up in a day or so.  SYMPTOMS TO REPORT IMMEDIATELY:   Following upper endoscopy (EGD)  Vomiting of blood or coffee ground material  New chest pain or pain under the shoulder blades  Painful or persistently difficult swallowing  New shortness of breath  Fever of 100F or higher  Black, tarry-looking stools  For urgent or emergent issues, a gastroenterologist can be reached at any hour by calling [phone].   DIET:  We do recommend a small meal at first, but then you may proceed to your regular diet.  Drink plenty of fluids but you should avoid alcoholic beverages for 24 hours.  ACTIVITY:  You should plan to take it easy for the rest of today and you should NOT DRIVE or use  heavy machinery until tomorrow (because of the sedation medicines used during the test).    FOLLOW UP: Our staff will call the number listed on your records 48-72 hours following your procedure to check on you and address any questions or concerns that you may have regarding the information given to you following your procedure. If we do not reach you, we will leave a message.  We will attempt to reach you two times.  During this call, we will ask if you have developed any symptoms of COVID 19. If you develop any symptoms (ie: fever, flu-like symptoms, shortness of breath, cough etc.) before then, please call [phone].  If you test positive for Covid 19 in the 2 weeks post procedure, please call and report this information to [phone].    If any biopsies were taken you will be contacted by phone or by letter within the next 1-3 weeks.  Please call us at [phone] if you have not heard about the biopsies in 3 weeks.    SIGNATURES/CONFIDENTIALITY: You and/or your care partner have signed paperwork which will be entered into your electronic medical record.  These signatures attest to the fact that that the information above on your After Visit Summary has been reviewed and is understood.  Full responsibility of the confidentiality of this discharge information lies with you and/or your care-partner.

## 2019-08-27 NOTE — Op Note (Signed)
[place] Patient Name: Rodney Jordan Procedure Date: 08/27/2019 9:32 AM MRN: [id number] Endoscopist: Jerene Bears , MD Age: 44 Referring MD:  Date of Birth: Aug 07, 1975 Gender: Male Account #: [id number] Procedure:                Upper GI endoscopy Indications:              Heartburn better with omeprazole, Gastro-esophageal                            reflux disease with persistent regurgitation                            despite PPI Medicines:                Monitored Anesthesia Care Procedure:                Pre-Anesthesia Assessment:                           - Prior to the procedure, a History and Physical                            was performed, and patient medications and                            allergies were reviewed. The patient's tolerance of                            previous anesthesia was also reviewed. The risks                            and benefits of the procedure and the sedation                            options and risks were discussed with the patient.                            All questions were answered, and informed consent                            was obtained. Prior Anticoagulants: The patient has                            taken no previous anticoagulant or antiplatelet                            agents. ASA Grade Assessment: II - A patient with                            mild systemic disease. After reviewing the risks                            and benefits, the patient was deemed in  satisfactory condition to undergo the procedure.                           After obtaining informed consent, the endoscope was                            passed under direct vision. Throughout the                            procedure, the patient's blood pressure, pulse, and                            oxygen saturations were monitored continuously. The                            Endoscope was introduced through the mouth, and                            advanced to the second part of duodenum. The upper                            GI endoscopy was accomplished without difficulty.                            The patient tolerated the procedure well. Scope In: Scope Out: Findings:                 The examined esophagus was normal. Biopsies were                            obtained from the proximal and distal esophagus                            with cold forceps for histology to exclude                            eosinophilic esophagitis.                           The gastroesophageal flap valve was visualized                            endoscopically and classified as Hill Grade I                            (prominent fold, tight to endoscope).                           Mildly congested mucosa was found in the prepyloric                            region of the stomach. Biopsies were taken with a                            cold forceps for histology and  Helicobacter pylori                            testing.                           The exam of the stomach was otherwise normal.                           The examined duodenum was normal. Complications:            No immediate complications. Estimated Blood Loss:     Estimated blood loss was minimal. Impression:               - Normal esophagus. Biopsied.                           - Gastroesophageal flap valve classified as Hill                            Grade I (prominent fold, tight to endoscope).                           - Congestive gastropathy. Biopsied.                           - Normal examined duodenum. Recommendation:           - Patient has a contact number available for                            emergencies. The signs and symptoms of potential                            delayed complications were discussed with the                            patient. Return to normal activities tomorrow.                            Written discharge instructions were  provided to the                            patient.                           - Resume previous diet.                           - Continue present medications.                           - Await pathology results to exclude eosinophilic                            esophagitis and reflux inflammation. Would  recommend trial of higher dose PPI, increase                            omeprazole to 40 mg once daily (30 min before                            meals). If persistent regurgitation then                            fundoplication (anti-reflux surgical procedure or                            TIF) can be discussed. Beverley Fiedler, MD 08/27/2019 9:57:35 AM This report has been signed electronically.

## 2019-08-27 NOTE — Progress Notes (Signed)
Temp-LC VS-KA 

## 2019-08-31 ENCOUNTER — Telehealth: Payer: Self-pay | Admitting: *Deleted

## 2019-08-31 ENCOUNTER — Telehealth: Payer: Self-pay

## 2019-08-31 NOTE — Telephone Encounter (Signed)
Left message on f/u call 

## 2019-08-31 NOTE — Telephone Encounter (Signed)
  Follow up Call-  Call back number 08/27/2019  Post procedure Call Back phone  # [phone]  Permission to leave phone message Yes  Some recent data might be hidden     Patient questions:  Do you have a fever, pain , or abdominal swelling? No. Pain Score  0 *  Have you tolerated food without any problems? Yes.    Have you been able to return to your normal activities? Yes.    Do you have any questions about your discharge instructions: Diet   No. Medications  No. Follow up visit  No.  Do you have questions or concerns about your Care? No.  Actions: * If pain score is 4 or above: No action needed, pain <4.  1. Have you developed a fever since your procedure? no  2.   Have you had an respiratory symptoms (SOB or cough) since your procedure? no  3.   Have you tested positive for COVID 19 since your procedure no  4.   Have you had any family members/close contacts diagnosed with the COVID 19 since your procedure?  no   If yes to any of these questions please route to Laverna Peace, RN and Jennye Boroughs, Charity fundraiser.

## 2019-09-01 ENCOUNTER — Encounter: Payer: Self-pay | Admitting: Internal Medicine

## 2019-10-05 NOTE — Telephone Encounter (Signed)
Thank patient for the update Okay to prescribe omeprazole either 40 mg every morning or 20 mg twice daily AC If he is interested in further discussing antireflux surgery such as TIF, he should let us know Otherwise he can follow-up in the office in about a year

## 2019-10-09 ENCOUNTER — Other Ambulatory Visit: Payer: Self-pay

## 2019-10-09 DIAGNOSIS — K219 Gastro-esophageal reflux disease without esophagitis: Secondary | ICD-10-CM

## 2019-10-09 MED ORDER — OMEPRAZOLE 20 MG PO CPDR: 40.0000 mg | DELAYED_RELEASE_CAPSULE | Freq: Every day | ORAL | 3 refills | Status: DC

## 2020-01-16 ENCOUNTER — Other Ambulatory Visit: Payer: Self-pay

## 2020-01-16 ENCOUNTER — Encounter (HOSPITAL_BASED_OUTPATIENT_CLINIC_OR_DEPARTMENT_OTHER): Payer: Self-pay | Admitting: Emergency Medicine

## 2020-01-16 ENCOUNTER — Emergency Department (HOSPITAL_BASED_OUTPATIENT_CLINIC_OR_DEPARTMENT_OTHER)
Admission: EM | Admit: 2020-01-16 | Discharge: 2020-01-16 | Disposition: A | Discharge status: Home / Self Care | Payer: No Typology Code available for payment source | Attending: Emergency Medicine | Admitting: Emergency Medicine

## 2020-01-16 DIAGNOSIS — M5417 Radiculopathy, lumbosacral region: Secondary | ICD-10-CM

## 2020-01-16 DIAGNOSIS — M545 Low back pain: Secondary | ICD-10-CM | POA: Diagnosis present

## 2020-01-16 DIAGNOSIS — F17210 Nicotine dependence, cigarettes, uncomplicated: Secondary | ICD-10-CM | POA: Insufficient documentation

## 2020-01-16 DIAGNOSIS — I10 Essential (primary) hypertension: Secondary | ICD-10-CM | POA: Insufficient documentation

## 2020-01-16 DIAGNOSIS — Z79899 Other long term (current) drug therapy: Secondary | ICD-10-CM | POA: Diagnosis not present

## 2020-01-16 MED ORDER — DEXAMETHASONE 6 MG PO TABS
10.0000 mg | ORAL_TABLET | Freq: Once | ORAL | Status: AC
  Administered 2020-01-16: 10 mg via ORAL
  Filled 2020-01-16: qty 1

## 2020-01-16 MED ORDER — KETOROLAC TROMETHAMINE 60 MG/2ML IM SOLN
60.0000 mg | Freq: Once | INTRAMUSCULAR | Status: AC
  Administered 2020-01-16: 60 mg via INTRAMUSCULAR
  Filled 2020-01-16: qty 2

## 2020-01-16 MED ORDER — METHOCARBAMOL 500 MG PO TABS: 500.0000 mg | ORAL_TABLET | Freq: Two times a day (BID) | ORAL | 0 refills | Status: DC

## 2020-01-16 MED ORDER — METHYLPREDNISOLONE 4 MG PO TBPK: ORAL_TABLET | ORAL | 0 refills | Status: DC

## 2020-01-16 NOTE — ED Provider Notes (Signed)
[place] EMERGENCY DEPARTMENT Provider Note   CSN: [id number] Arrival date & time: 01/16/20  2952     History Chief Complaint  Patient presents with  . Back Pain    Rodney Jordan is a 44 y.o. male.  HPI      Back pain progressing since Monday Thursday pain began to radiate through left buttocks/hip, pain radiating down leg Constant pain, but sometimes depending on how sitting, standing moving will get shooting pain, outside of left leg tingling and numb down to great toe Severe cramp last night Had COVID one month ago 2 herniated discs, bulging discs but never had pain last this long Ibuprofen and muscle relaxer that has had for years. Combining both of them was able to function and helped with lower back pain but now pain in buttock and leg  No weakness No falls or trauma No hx of cancer or IVDU No loss of control of bowel or bladder No urinary symptoms, abdominal pain, chest pain or dyspnea No hx of DVT/PE  Past Medical History:  Diagnosis Date  . Acid reflux   . ADHD   . Hypertension     There are no problems to display for this patient.   Past Surgical History:  Procedure Laterality Date  . BICEPS TENDON REPAIR Right   . SHOULDER SURGERY Left    burses removed       Family History  Problem Relation Age of Onset  . Ovarian cancer Mother   . Heart disease Father   . Diabetes Father   . Hypertension Father   . Hypertension Brother   . Hypertension Brother   . Colon cancer Neg Hx   . Esophageal cancer Neg Hx   . Rectal cancer Neg Hx   . Stomach cancer Neg Hx     Social History   Tobacco Use  . Smoking status: Current Some Day Smoker    Types: Cigars  . Smokeless tobacco: Former Network engineer  . Vaping Use: Never used  Substance Use Topics  . Alcohol use: Yes    Comment: 1 drink every two weeks  . Drug use: Never    Home Medications Prior to Admission medications   Medication Sig Start Date End Date Taking?  Authorizing Provider  ADDERALL XR 30 MG 24 hr capsule Take 30 mg by mouth every morning. 08/05/19   [provider]  amLODipine (NORVASC) 5 MG tablet Take 5 mg by mouth daily. 03/02/19   [provider]  chlorthalidone (HYGROTON) 25 MG tablet Take 1 tablet by mouth daily. 05/07/19   [provider]  irbesartan (AVAPRO) 300 MG tablet Take 300 mg by mouth at bedtime. 03/02/19   [provider]  loratadine (CLARITIN) 10 MG tablet Take 1 tablet by mouth daily.    [provider]  methocarbamol (ROBAXIN) 500 MG tablet Take 1 tablet (500 mg total) by mouth 2 (two) times daily. 01/16/20   Gareth Morgan, MD  methylPREDNISolone (MEDROL DOSEPAK) 4 MG TBPK tablet Follow instructions 01/16/20   Gareth Morgan, MD  Multiple Vitamin (MULTIVITAMIN) capsule Take 1 capsule by mouth daily.    [provider]  omeprazole (PRILOSEC) 20 MG capsule Take 2 capsules (40 mg total) by mouth daily. 10/09/19   Pyrtle, Lajuan Lines, MD    Allergies    Patient has no known allergies.  Review of Systems   Review of Systems  Constitutional: Negative for fever.  Respiratory: Negative for shortness of breath.   Cardiovascular:  Negative for chest pain.  Gastrointestinal: Negative for abdominal pain and vomiting.  Genitourinary: Negative for difficulty urinating and dysuria.  Musculoskeletal: Positive for back pain.  Skin: Negative for rash.  Neurological: Positive for numbness. Negative for weakness.    Physical Exam Updated Vital Signs BP (!) 149/99 (BP Location: Right Arm)   Pulse 87   Temp 98.1 F (36.7 C)   Resp 18   Ht 6' (1.829 m)   Wt 117.9 kg   SpO2 100%   BMI 35.26 kg/m   Physical Exam Vitals and nursing note reviewed.  Constitutional:      General: He is not in acute distress.    Appearance: Normal appearance. He is not ill-appearing, toxic-appearing or diaphoretic.  HENT:     Head: Normocephalic.  Eyes:     Conjunctiva/sclera: Conjunctivae normal.    Cardiovascular:     Rate and Rhythm: Normal rate and regular rhythm.     Pulses: Normal pulses.  Pulmonary:     Effort: Pulmonary effort is normal. No respiratory distress.  Musculoskeletal:        General: No deformity or signs of injury.     Cervical back: No rigidity.  Skin:    General: Skin is warm and dry.     Coloration: Skin is not jaundiced or pale.  Neurological:     General: No focal deficit present.     Mental Status: He is alert and oriented to person, place, and time.     Comments: 5/5 hip flexion/extension, knee flexion/extension 4/5 dorsiflexion and great toe extension Decreased sensation lateral leg and dorsal foot/webspace left foot      ED Results / Procedures / Treatments   Labs (all labs ordered are listed, but only abnormal results are displayed) Labs Reviewed - No data to display  EKG None  Radiology No results found.  Procedures Procedures (including critical care time)  Medications Ordered in ED Medications  ketorolac (TORADOL) injection 60 mg (60 mg Intramuscular Given 01/16/20 0941)  dexamethasone (DECADRON) tablet 10 mg (10 mg Oral Given 01/16/20 0941)    ED Course  I have reviewed the triage vital signs and the nursing notes.  Pertinent labs & imaging results that were available during my care of the patient were reviewed by me and considered in my medical decision making (see chart for details).    MDM Rules/Calculators/A&P                          44yo male presents with concern for left sided radicular pain. He denies any urinary retention or overflow incontinence, stool incontinence, saddle anesthesia, fever, IV drug use, trauma, chronic steroid use or immunocompromise and have low suspicion suspicion for cauda equina, fracture, epidural abscess, or vertebral osteomyelitis.  Normal pulses, no sign of arterial occlusion, no sign of DVT.  History and exam consistent with radicular pain in L5 distribution with associated numbness and  mild weakness of dorsiflexion and great toe extension.  Discussed with Selena Batten of [facility] on call. Discussed we can transfer him for MRI however given exam described we would not anticipate need for emergent surgery. Discussed with patient and given mild findings on exam we agree to forego transfer and plan for close follow up with Dr. Yetta Barre on Tuesday unless he develops worsening weakness, loss of control of bowel or bladder at which point he will return to the ED.  Given rx for medrol dosepack and methocarbamol.  Recommend continued ibuprofen and tylenol.  Patient discharged in stable condition with understanding of reasons to return.    Final Clinical Impression(s) / ED Diagnoses Final diagnoses:  Lumbosacral radiculopathy at L5    Rx / DC Orders ED Discharge Orders         Ordered    methylPREDNISolone (MEDROL DOSEPAK) 4 MG TBPK tablet     Discontinue  Reprint     01/16/20 0949    methocarbamol (ROBAXIN) 500 MG tablet  2 times daily     Discontinue  Reprint     01/16/20 1004           Alvira Monday, MD 01/16/20 1850

## 2020-01-16 NOTE — ED Triage Notes (Addendum)
L back pain radiating down into leg causing numbness to big toe. Had COVID a month ago. Hx of herniated disc.

## 2020-02-12 ENCOUNTER — Other Ambulatory Visit: Payer: Self-pay | Admitting: Internal Medicine

## 2020-02-12 DIAGNOSIS — K219 Gastro-esophageal reflux disease without esophagitis: Secondary | ICD-10-CM

## 2020-06-18 DIAGNOSIS — K219 Gastro-esophageal reflux disease without esophagitis: Secondary | ICD-10-CM

## 2020-06-20 MED ORDER — OMEPRAZOLE 40 MG PO CPDR: 40.0000 mg | DELAYED_RELEASE_CAPSULE | Freq: Every morning | ORAL | 1 refills | Status: DC

## 2020-11-16 ENCOUNTER — Other Ambulatory Visit: Payer: Self-pay | Admitting: *Deleted

## 2020-11-16 DIAGNOSIS — K219 Gastro-esophageal reflux disease without esophagitis: Secondary | ICD-10-CM

## 2020-11-16 MED ORDER — OMEPRAZOLE 40 MG PO CPDR: 40.0000 mg | DELAYED_RELEASE_CAPSULE | Freq: Every morning | ORAL | 1 refills | Status: DC

## 2021-05-21 ENCOUNTER — Other Ambulatory Visit: Payer: Self-pay | Admitting: Internal Medicine

## 2021-05-21 DIAGNOSIS — K219 Gastro-esophageal reflux disease without esophagitis: Secondary | ICD-10-CM

## 2021-05-25 ENCOUNTER — Other Ambulatory Visit: Payer: Self-pay | Admitting: Internal Medicine

## 2021-05-25 DIAGNOSIS — K219 Gastro-esophageal reflux disease without esophagitis: Secondary | ICD-10-CM

## 2021-05-31 ENCOUNTER — Other Ambulatory Visit: Payer: Self-pay | Admitting: Internal Medicine

## 2021-05-31 DIAGNOSIS — K219 Gastro-esophageal reflux disease without esophagitis: Secondary | ICD-10-CM

## 2021-06-01 NOTE — Telephone Encounter (Signed)
Date: 06/01/2021 Erick Blinks [address] [place], [place] [address] RE: Melina Fiddler approved your request for coverage of Omeprazole 40MG  OR CPDR. Dear : Jacobo Forest pleased to let you know that we've approved your or your doctor's request for coverage (sometimes called a prior authorization) for Omeprazole 40MG  OR CPDR. You can now fill your prescription, and it will be covered according to your plan. As long as you remain covered by your prescription drug plan and there are no changes to your plan benefits, this request is approved from 06/01/2021 to 05/31/2024. When this approval expires, please speak to your doctor about your treatment. Sincerely, [facility] cc: Dr. 13/09/2020

## 2021-08-31 ENCOUNTER — Other Ambulatory Visit: Payer: Self-pay | Admitting: Internal Medicine

## 2021-08-31 DIAGNOSIS — K219 Gastro-esophageal reflux disease without esophagitis: Secondary | ICD-10-CM

## 2023-03-25 ENCOUNTER — Encounter: Payer: Self-pay | Admitting: Internal Medicine

## 2023-04-15 ENCOUNTER — Encounter: Payer: Self-pay | Admitting: Internal Medicine

## 2023-05-22 ENCOUNTER — Encounter: Payer: No Typology Code available for payment source | Admitting: Internal Medicine

## 2023-06-14 ENCOUNTER — Ambulatory Visit (AMBULATORY_SURGERY_CENTER): Payer: 59 | Admitting: *Deleted

## 2023-06-14 ENCOUNTER — Encounter: Payer: Self-pay | Admitting: Internal Medicine

## 2023-06-14 VITALS — Ht 72.0 in | Wt 310.0 lb

## 2023-06-14 DIAGNOSIS — Z1211 Encounter for screening for malignant neoplasm of colon: Secondary | ICD-10-CM

## 2023-06-14 MED ORDER — NA SULFATE-K SULFATE-MG SULF 17.5-3.13-1.6 GM/177ML PO SOLN: 1.0000 | Freq: Once | ORAL | 0 refills | Status: AC

## 2023-06-14 NOTE — Progress Notes (Signed)
Pre visit completed over telephone. Instructions forwarded through MyChart. Patient instructed to contact us if he experiences change in health, ER visit, or new medications.   No egg or soy allergy known to patient  No issues known to pt with past sedation with any surgeries or procedures Patient denies ever being told they had issues or difficulty with intubation  No FH of Malignant Hyperthermia Pt is not on diet pills Pt is not on  home 02  Pt is not on blood thinners  Pt denies issues with constipation  No A fib or A flutter Have any cardiac testing pending--NO Pt instructed to use [email or web address] or GoodRx for a price reduction on prep

## 2023-06-23 ENCOUNTER — Encounter: Payer: Self-pay | Admitting: Internal Medicine

## 2023-06-24 ENCOUNTER — Ambulatory Visit: Payer: 59 | Attending: Cardiology | Admitting: Cardiology

## 2023-06-24 ENCOUNTER — Encounter: Payer: Self-pay | Admitting: Cardiology

## 2023-06-24 VITALS — BP 134/82 | HR 76 | Ht 72.0 in | Wt 327.0 lb

## 2023-06-24 DIAGNOSIS — I429 Cardiomyopathy, unspecified: Secondary | ICD-10-CM

## 2023-06-24 DIAGNOSIS — I10 Essential (primary) hypertension: Secondary | ICD-10-CM

## 2023-06-24 DIAGNOSIS — Z8249 Family history of ischemic heart disease and other diseases of the circulatory system: Secondary | ICD-10-CM

## 2023-06-24 NOTE — Progress Notes (Signed)
Cardiology Office Note:  .   Date:  06/24/2023  ID:  Rodney Jordan, DOB [birth year], MRN [id number] PCP: No primary care provider on file.  [facility] Providers Cardiologist:  None     History of Present Illness: .   Rodney Jordan is a 47 y.o. male Discussed with the use of AI scribe   History of Present Illness   The patient, a 47 year old male with a history of cardiomyopathy dating back to the late 1990s, presents for a routine check-up after a long period of not seeing a cardiologist. The cardiomyopathy was discovered during the patient's college years when he was highly active, playing lacrosse and working out frequently. The patient experienced an episode on the lacrosse field where he collapsed but did not require any shocks or interventions to recover. Following this episode, the patient was found to have high blood pressure and an enlarged heart, which was managed with a six-month period of rest and treatment, resulting in the heart returning to a normal size.  The patient has been asymptomatic for the past 20 years, with no chest pain, shortness of breath, or fainting episodes. However, he has been on two blood pressure medications, chlorthalidone and irbesartan, and would ideally like to reduce this medication burden. The patient's blood pressure readings at home generally range from 120 to 135 systolic and are in the 80s diastolic.  The patient has a family history of heart disease, with his father having had congestive heart failure and ultimately passing away from a massive heart attack at the age of 29. The patient does not smoke or consume significant amounts of alcohol. His most recent lipid panel showed an LDL of 146, and his ferritin levels have been consistently higher than normal for the past three years. The patient is currently overweight and acknowledges the need for weight loss.            Studies Reviewed: Marland Kitchen   EKG  Interpretation Date/Time:  Monday June 24 2023 08:02:13 EST Ventricular Rate:  76 PR Interval:  158 QRS Duration:  106 QT Interval:  372 QTC Calculation: 418 R Axis:   28  Text Interpretation: Normal sinus rhythm with sinus arrhythmia Nonspecific ST abnormality When compared with ECG of 25-Jan-2003 08:25, Criteria for Septal infarct are no longer Present Confirmed by Donato Schultz ([id number]) on 06/24/2023 8:18:29 AM    Results LABS LDL: 169 (06/23/2022) Creatinine: 0.9 (06/23/2022) ALT: 31 (06/23/2022) Hemoglobin: 14.7 (06/23/2022) A1c: 5.5 (06/23/2022) TSH: 1.2 (06/23/2022) LDL: 146 (05/27/2023) Ferritin: 529 (05/27/2023) A1c: 5.7 (05/27/2023) High Sensitivity CRP: 0.5 (05/27/2023)  DIAGNOSTIC EKG: Normal (06/24/2023)  Risk Assessment/Calculations:            Physical Exam:   VS:  BP 134/82   Pulse 76   Ht 6' (1.829 m)   Wt (!) 327 lb (148.3 kg)   SpO2 95%   BMI 44.35 kg/m    Wt Readings from Last 3 Encounters:  06/24/23 (!) 327 lb (148.3 kg)  06/14/23 (!) 310 lb (140.6 kg)  01/16/20 260 lb (117.9 kg)    GEN: Well nourished, well developed in no acute distress NECK: No JVD; No carotid bruits CARDIAC: RRR, no murmurs, no rubs, no gallops RESPIRATORY:  Clear to auscultation without rales, wheezing or rhonchi  ABDOMEN: Soft, non-tender, non-distended EXTREMITIES:  No edema; No deformity   ASSESSMENT AND PLAN: .    Assessment and Plan    Cardiomyopathy Cardiomyopathy likely secondary to undiagnosed hypertension and intense physical activity. Asymptomatic  currently. Previous collapse on lacrosse field without defibrillation. Family history of congestive heart failure. Blood pressure well-controlled on chlorthalidone and irbesartan. Recent EKG normal. Discussed echocardiogram to assess cardiac function and coronary calcium score for calcified plaque evaluation. Explained coronary calcium score as a non-contrast CT scan costing $99, used for cardiac risk assessment.  Potential need for aggressive cholesterol management if significant plaque is found. - Order echocardiogram - Order coronary calcium score - Continue irbesartan and chlorthalidone - Promote Mediterranean diet and regular exercise - Monitor blood pressure regularly  Hypertension Long-standing hypertension managed with chlorthalidone and irbesartan. Home blood pressure readings range from 120-135/80s. Office reading today was 134/82. Asymptomatic. Emphasized diet and exercise, specifically the Mediterranean diet and 30 minutes of daily exercise. - Continue irbesartan and chlorthalidone - Promote Mediterranean diet and regular exercise - Monitor blood pressure regularly  Hyperlipidemia Elevated LDL cholesterol (146 mg/dL), previously 188 mg/dL. Asymptomatic. Discussed potential statin therapy if coronary calcium score is elevated. Emphasized diet and exercise. - Continue diet and exercise - Consider statin therapy if coronary calcium score is elevated  Elevated Ferritin Persistently elevated ferritin levels (most recently 529 ng/mL). Discussed potential hemochromatosis and its cardiac impact. Will monitor cardiac function with echocardiogram. - Order echocardiogram - Monitor ferritin levels  General Health Maintenance Non-smoker, minimal alcohol consumption. No signs of diabetes or anemia. Normal kidney and liver function. TSH within normal limits. - Promote Mediterranean diet and regular exercise - Monitor blood pressure and lipid levels regularly  Follow-up - Will follow-up with results of echocardiogram and coronary calcium score.               Signed, Donato Schultz, MD

## 2023-06-24 NOTE — Patient Instructions (Signed)
Medication Instructions:  The current medical regimen is effective;  continue present plan and medications.  *If you need a refill on your cardiac medications before your next appointment, please call your pharmacy*  Testing/Procedures: Your physician has requested that you have an echocardiogram. Echocardiography is a painless test that uses sound waves to create images of your heart. It provides your doctor with information about the size and shape of your heart and how well your heart's chambers and valves are working. This procedure takes approximately one hour. There are no restrictions for this procedure. Please do NOT wear cologne, perfume, aftershave, or lotions (deodorant is allowed). Please arrive 15 minutes prior to your appointment time.  Please note: We ask at that you not bring children with you during ultrasound (echo/ vascular) testing. Due to room size and safety concerns, children are not allowed in the ultrasound rooms during exams. Our front office staff cannot provide observation of children in our lobby area while testing is being conducted. An adult accompanying a patient to their appointment will only be allowed in the ultrasound room at the discretion of the ultrasound technician under special circumstances. We apologize for any inconvenience.  Your physician has requested that you have a Coronary Calcium score which is completed by CT. Cardiac computed tomography (CT) is a painless test that uses an x-ray machine to take clear, detailed pictures of your heart. There are no instructions for this testing.  You may eat/drink and take your normal medications this day.  The cost of the testing is $99 due at the time of your appointment.   Follow-Up: At [facility], you and your health needs are our priority.  As part of our continuing mission to provide you with exceptional heart care, we have created designated Provider Care Teams.  These Care Teams include your  primary Cardiologist (physician) and Advanced Practice Providers (APPs -  Physician Assistants and Nurse Practitioners) who all work together to provide you with the care you need, when you need it.  We recommend signing up for the patient portal called "MyChart".  Sign up information is provided on this After Visit Summary.  MyChart is used to connect with patients for Virtual Visits (Telemedicine).  Patients are able to view lab/test results, encounter notes, upcoming appointments, etc.  Non-urgent messages can be sent to your provider as well.   To learn more about what you can do with MyChart, go to [email or web address].    Your next appointment:  Your follow up will be based on the results of the above testing.

## 2023-07-01 ENCOUNTER — Ambulatory Visit (AMBULATORY_SURGERY_CENTER): Payer: 59 | Admitting: Internal Medicine

## 2023-07-01 ENCOUNTER — Encounter: Payer: Self-pay | Admitting: Internal Medicine

## 2023-07-01 VITALS — BP 157/94 | HR 67 | Temp 97.9°F | Resp 17 | Ht 72.0 in | Wt 310.0 lb

## 2023-07-01 DIAGNOSIS — K648 Other hemorrhoids: Secondary | ICD-10-CM

## 2023-07-01 DIAGNOSIS — K5730 Diverticulosis of large intestine without perforation or abscess without bleeding: Secondary | ICD-10-CM | POA: Diagnosis not present

## 2023-07-01 DIAGNOSIS — Z1211 Encounter for screening for malignant neoplasm of colon: Secondary | ICD-10-CM

## 2023-07-01 MED ORDER — SODIUM CHLORIDE 0.9 % IV SOLN: 500.0000 mL | Freq: Once | INTRAVENOUS | Status: DC

## 2023-07-01 NOTE — Progress Notes (Signed)
Pt's states no medical or surgical changes since previsit or office visit. 

## 2023-07-01 NOTE — Patient Instructions (Signed)
Thank you for letting us take care of your healthcare needs today! Please see handouts regarding Hemorrhoids and Diverticulosis.  YOU HAD AN ENDOSCOPIC PROCEDURE TODAY AT THE [facility]:   Refer to the procedure report that was given to you for any specific questions about what was found during the examination.  If the procedure report does not answer your questions, please call your gastroenterologist to clarify.  If you requested that your care partner not be given the details of your procedure findings, then the procedure report has been included in a sealed envelope for you to review at your convenience later.  YOU SHOULD EXPECT: Some feelings of bloating in the abdomen. Passage of more gas than usual.  Walking can help get rid of the air that was put into your GI tract during the procedure and reduce the bloating. If you had a lower endoscopy (such as a colonoscopy or flexible sigmoidoscopy) you may notice spotting of blood in your stool or on the toilet paper. If you underwent a bowel prep for your procedure, you may not have a normal bowel movement for a few days.  Please Note:  You might notice some irritation and congestion in your nose or some drainage.  This is from the oxygen used during your procedure.  There is no need for concern and it should clear up in a day or so.  SYMPTOMS TO REPORT IMMEDIATELY:  Following lower endoscopy (colonoscopy or flexible sigmoidoscopy):  Excessive amounts of blood in the stool  Significant tenderness or worsening of abdominal pains  Swelling of the abdomen that is new, acute  Fever of 100F or higher  For urgent or emergent issues, a gastroenterologist can be reached at any hour by calling [phone]. Do not use MyChart messaging for urgent concerns.    DIET:  We do recommend a small meal at first, but then you may proceed to your regular diet.  Drink plenty of fluids but you should avoid alcoholic beverages for 24  hours.  ACTIVITY:  You should plan to take it easy for the rest of today and you should NOT DRIVE or use heavy machinery until tomorrow (because of the sedation medicines used during the test).    FOLLOW UP: Our staff will call the number listed on your records the next business day following your procedure.  We will call around 7:15- 8:00 am to check on you and address any questions or concerns that you may have regarding the information given to you following your procedure. If we do not reach you, we will leave a message.     If any biopsies were taken you will be contacted by phone or by letter within the next 1-3 weeks.  Please call us at [phone] if you have not heard about the biopsies in 3 weeks.    SIGNATURES/CONFIDENTIALITY: You and/or your care partner have signed paperwork which will be entered into your electronic medical record.  These signatures attest to the fact that that the information above on your After Visit Summary has been reviewed and is understood.  Full responsibility of the confidentiality of this discharge information lies with you and/or your care-partner.

## 2023-07-01 NOTE — Progress Notes (Signed)
GASTROENTEROLOGY PROCEDURE H&P NOTE   Primary Care Physician: No primary care provider on file.    Reason for Procedure:  Colon cancer screening  Plan:    Colonoscopy  Patient is appropriate for endoscopic procedure(s) in the ambulatory ([facility]) setting.  The nature of the procedure, as well as the risks, benefits, and alternatives were carefully and thoroughly reviewed with the patient. Ample time for discussion and questions allowed. The patient understood, was satisfied, and agreed to proceed.     HPI: Rodney Jordan is a 47 y.o. male who presents for colonoscopy.  Medical history as below.  Tolerated the prep.  No recent chest pain or shortness of breath.  No abdominal pain today.  Past Medical History:  Diagnosis Date   Acid reflux    ADHD    Hypertension     Past Surgical History:  Procedure Laterality Date   BICEPS TENDON REPAIR Bilateral    2019  2024   SHOULDER SURGERY Left    burses removed    Prior to Admission medications   Medication Sig Start Date End Date Taking? Authorizing Provider  chlorthalidone (HYGROTON) 25 MG tablet Take 1 tablet by mouth daily. 05/07/19  Yes [provider]  irbesartan (AVAPRO) 300 MG tablet Take 300 mg by mouth at bedtime. 03/02/19  Yes [provider]  Multiple Vitamin (MULTIVITAMIN) capsule Take 1 capsule by mouth daily.   Yes [provider]  Omega-3 Fatty Acids (FISH OIL) 1000 MG CAPS Take by mouth.   Yes [provider]  testosterone cypionate (DEPOTESTOSTERONE CYPIONATE) 200 MG/ML injection Inject into the muscle. 05/28/23  Yes [provider]  Acetaminophen Extra Strength 500 MG CAPS Take 2 capsules by mouth every 8 (eight) hours. 01/09/23   [provider]    Current Outpatient Medications  Medication Sig Dispense Refill   chlorthalidone (HYGROTON) 25 MG tablet Take 1 tablet by mouth daily.     irbesartan (AVAPRO) 300 MG tablet Take 300 mg by mouth at bedtime.      Multiple Vitamin (MULTIVITAMIN) capsule Take 1 capsule by mouth daily.     Omega-3 Fatty Acids (FISH OIL) 1000 MG CAPS Take by mouth.     testosterone cypionate (DEPOTESTOSTERONE CYPIONATE) 200 MG/ML injection Inject into the muscle.     Acetaminophen Extra Strength 500 MG CAPS Take 2 capsules by mouth every 8 (eight) hours.     Current Facility-Administered Medications  Medication Dose Route Frequency Provider Last Rate Last Admin   0.9 %  sodium chloride infusion  500 mL Intravenous Once Irina Okelly, Carie Caddy, MD        Allergies as of 07/01/2023   (No Known Allergies)    Family History  Problem Relation Age of Onset   Ovarian cancer Mother    Heart disease Father    Diabetes Father    Hypertension Father    Hypertension Brother    Hypertension Brother    Colon cancer Other    Colon polyps Other    Esophageal cancer Neg Hx    Rectal cancer Neg Hx    Stomach cancer Neg Hx     Social History   Socioeconomic History   Marital status: Married    Spouse name: Not on file   Number of children: Not on file   Years of education: Not on file   Highest education level: Not on file  Occupational History   Not on file  Tobacco Use   Smoking status: Some Days  Types: Cigars   Smokeless tobacco: Former  Building services engineer status: Never Used  Substance and Sexual Activity   Alcohol use: Yes    Comment: 1 drink every two weeks   Drug use: Never   Sexual activity: Not on file  Other Topics Concern   Not on file  Social History Narrative   Not on file   Social Determinants of Health   Financial Resource Strain: Low Risk  (12/13/2020)   Received from [facility]   Overall Financial Resource Strain (CARDIA)    Difficulty of Paying Living Expenses: Not hard at all  Food Insecurity: No Food Insecurity (12/13/2020)   Received [facility]   Hunger Vital Sign    Worried About Running Out of Food in the Last Year: Never true    Ran Out of Food in the Last Year: Never  true  Transportation Needs: No Transportation Needs (12/13/2020)   Received from [facility] - Transportation    Lack of Transportation (Medical): No    Lack of Transportation (Non-Medical): No  Physical Activity: Inactive (12/13/2020)   Received from [facility]   Exercise Vital Sign    Days of Exercise per Week: 0 days    Minutes of Exercise per Session: 0 min  Stress: No Stress Concern Present (12/13/2020)   Received from [facility] - Occupational Stress Questionnaire    Feeling of Stress : Not at all  Social Connections: Unknown (03/30/2023)   Received from [facility]   Social Network    Social Network: Not on file  Intimate Partner Violence: Unknown (03/30/2023)   Received from [facility]   HITS    Physically Hurt: Not on file    Insult or Talk Down To: Not on file    Threaten Physical Harm: Not on file    Scream or Curse: Not on file    Physical Exam: Vital signs in last 24 hours: @BP  (!) 174/99   Pulse 66   Temp 97.9 F (36.6 C) (Temporal)   Resp 18   Ht 6' (1.829 m)   Wt (!) 310 lb (140.6 kg)   SpO2 100%   BMI 42.04 kg/m  GEN: NAD EYE: Sclerae anicteric ENT: MMM CV: Non-tachycardic Pulm: CTA b/l GI: Soft, NT/ND NEURO:  Alert & Oriented x 3   Erick Blinks, MD [facility]  07/01/2023 1:38 PM

## 2023-07-01 NOTE — Op Note (Signed)
[facility] Patient Name: Rodney Jordan Procedure Date: 07/01/2023 1:24 PM MRN: [id number] Endoscopist: Beverley Fiedler , MD, [id number] Age: 47 Referring MD:  Date of Birth: 1976-02-17 Gender: Male Account #: [id number] Procedure:                Colonoscopy Indications:              Screening for colorectal malignant neoplasm, This                            is the patient's first colonoscopy Medicines:                Monitored Anesthesia Care Procedure:                Pre-Anesthesia Assessment:                           - Prior to the procedure, a History and Physical                            was performed, and patient medications and                            allergies were reviewed. The patient's tolerance of                            previous anesthesia was also reviewed. The risks                            and benefits of the procedure and the sedation                            options and risks were discussed with the patient.                            All questions were answered, and informed consent                            was obtained. Prior Anticoagulants: The patient has                            taken no anticoagulant or antiplatelet agents. ASA                            Grade Assessment: II - A patient with mild systemic                            disease. After reviewing the risks and benefits,                            the patient was deemed in satisfactory condition to                            undergo the procedure.  After obtaining informed consent, the colonoscope                            was passed under direct vision. Throughout the                            procedure, the patient's blood pressure, pulse, and                            oxygen saturations were monitored continuously. The                            CF [id number] #[id number] was introduced through the anus                            and advanced to the  cecum, identified by                            appendiceal orifice and ileocecal valve. The                            colonoscopy was performed without difficulty. The                            patient tolerated the procedure well. The quality                            of the bowel preparation was good. The ileocecal                            valve, appendiceal orifice, and rectum were                            photographed. Scope In: 1:41:03 PM Scope Out: 2:00:11 PM Scope Withdrawal Time: 0 hours 10 minutes 37 seconds  Total Procedure Duration: 0 hours 19 minutes 8 seconds  Findings:                 The digital rectal exam was normal.                           A few small-mouthed diverticula were found in the                            sigmoid colon.                           Internal hemorrhoids were found during                            retroflexion. The hemorrhoids were small.                           The exam was otherwise without abnormality. Complications:            No immediate complications. Estimated Blood Loss:  Estimated blood loss: none. Impression:               - Mild diverticulosis in the sigmoid colon.                           - Small internal hemorrhoids.                           - The examination was otherwise normal.                           - No specimens collected. Recommendation:           - Patient has a contact number available for                            emergencies. The signs and symptoms of potential                            delayed complications were discussed with the                            patient. Return to normal activities tomorrow.                            Written discharge instructions were provided to the                            patient.                           - Resume previous diet.                           - Continue present medications.                           - Repeat colonoscopy in 10 years for screening                             purposes. Beverley Fiedler, MD 07/01/2023 2:03:13 PM This report has been signed electronically.

## 2023-07-01 NOTE — Progress Notes (Signed)
Vss nad trans to pacu 

## 2023-07-02 ENCOUNTER — Telehealth: Payer: Self-pay

## 2023-07-02 NOTE — Telephone Encounter (Signed)
  Follow up Call-     07/01/2023   12:39 PM  Call back number  Post procedure Call Back phone  # [phone]  Permission to leave phone message Yes     Patient questions:  Do you have a fever, pain , or abdominal swelling? No. Pain Score  0 *  Have you tolerated food without any problems? Yes.    Have you been able to return to your normal activities? Yes.    Do you have any questions about your discharge instructions: Diet   No. Medications  No. Follow up visit  No.  Do you have questions or concerns about your Care? No.  Actions: * If pain score is 4 or above: No action needed, pain <4.

## 2023-07-18 ENCOUNTER — Ambulatory Visit (INDEPENDENT_AMBULATORY_CARE_PROVIDER_SITE_OTHER): Payer: 59

## 2023-07-18 ENCOUNTER — Ambulatory Visit (HOSPITAL_BASED_OUTPATIENT_CLINIC_OR_DEPARTMENT_OTHER)
Admission: RE | Admit: 2023-07-18 | Discharge: 2023-07-18 | Disposition: A | Discharge status: Home / Self Care | Payer: 59 | Source: Ambulatory Visit | Attending: Cardiology | Admitting: Cardiology

## 2023-07-18 DIAGNOSIS — I429 Cardiomyopathy, unspecified: Secondary | ICD-10-CM

## 2023-07-18 DIAGNOSIS — Z8249 Family history of ischemic heart disease and other diseases of the circulatory system: Secondary | ICD-10-CM | POA: Insufficient documentation

## 2023-07-18 DIAGNOSIS — I10 Essential (primary) hypertension: Secondary | ICD-10-CM

## 2023-07-18 LAB — ECHOCARDIOGRAM COMPLETE
Area-P 1/2: 3.83 cm2
S' Lateral: 3.71 cm

## 2023-07-18 MED ORDER — PERFLUTREN LIPID MICROSPHERE
1.0000 mL | INTRAVENOUS | Status: AC | PRN
  Administered 2023-07-18: 1 mL via INTRAVENOUS
